# Patient Record
Sex: Female | Born: 1940 | Race: White | Hispanic: No | Smoking: Never smoker
Health system: Southern US, Community
[De-identification: ages and names within clinical notes are randomized; demographics above are authoritative.]

## PROBLEM LIST (undated history)

## (undated) DIAGNOSIS — T8859XA Other complications of anesthesia, initial encounter: Secondary | ICD-10-CM

## (undated) DIAGNOSIS — R112 Nausea with vomiting, unspecified: Secondary | ICD-10-CM

## (undated) DIAGNOSIS — Z9889 Other specified postprocedural states: Secondary | ICD-10-CM

## (undated) DIAGNOSIS — I10 Essential (primary) hypertension: Secondary | ICD-10-CM

## (undated) DIAGNOSIS — M1990 Unspecified osteoarthritis, unspecified site: Secondary | ICD-10-CM

## (undated) DIAGNOSIS — T4145XA Adverse effect of unspecified anesthetic, initial encounter: Secondary | ICD-10-CM

## (undated) HISTORY — PX: CHOLECYSTECTOMY: SHX55

## (undated) HISTORY — PX: ANKLE SURGERY: SHX546

## (undated) HISTORY — PX: ABDOMINAL HYSTERECTOMY: SHX81

---

## 1997-08-21 ENCOUNTER — Ambulatory Visit (HOSPITAL_COMMUNITY): Admission: RE | Admit: 1997-08-21 | Discharge: 1997-08-21 | Payer: Self-pay | Admitting: Family Medicine

## 2000-01-07 ENCOUNTER — Encounter: Payer: Self-pay | Admitting: Obstetrics & Gynecology

## 2000-01-07 ENCOUNTER — Ambulatory Visit (HOSPITAL_COMMUNITY): Admission: RE | Admit: 2000-01-07 | Discharge: 2000-01-07 | Payer: Self-pay | Admitting: Obstetrics & Gynecology

## 2000-12-16 ENCOUNTER — Encounter: Payer: Self-pay | Admitting: Surgery

## 2000-12-16 ENCOUNTER — Encounter (INDEPENDENT_AMBULATORY_CARE_PROVIDER_SITE_OTHER): Payer: Self-pay | Admitting: Specialist

## 2000-12-16 ENCOUNTER — Observation Stay (HOSPITAL_COMMUNITY): Admission: RE | Admit: 2000-12-16 | Discharge: 2000-12-17 | Payer: Self-pay | Admitting: Surgery

## 2001-01-06 ENCOUNTER — Encounter: Payer: Self-pay | Admitting: Family Medicine

## 2001-01-06 ENCOUNTER — Ambulatory Visit (HOSPITAL_COMMUNITY): Admission: RE | Admit: 2001-01-06 | Discharge: 2001-01-06 | Payer: Self-pay | Admitting: Family Medicine

## 2002-01-09 ENCOUNTER — Encounter: Payer: Self-pay | Admitting: Family Medicine

## 2002-01-09 ENCOUNTER — Ambulatory Visit (HOSPITAL_COMMUNITY): Admission: RE | Admit: 2002-01-09 | Discharge: 2002-01-09 | Payer: Self-pay | Admitting: Obstetrics & Gynecology

## 2003-01-11 ENCOUNTER — Ambulatory Visit (HOSPITAL_COMMUNITY): Admission: RE | Admit: 2003-01-11 | Discharge: 2003-01-11 | Payer: Self-pay | Admitting: Obstetrics & Gynecology

## 2004-01-22 ENCOUNTER — Ambulatory Visit (HOSPITAL_COMMUNITY): Admission: RE | Admit: 2004-01-22 | Discharge: 2004-01-22 | Payer: Self-pay | Admitting: Family Medicine

## 2005-03-03 ENCOUNTER — Ambulatory Visit (HOSPITAL_COMMUNITY): Admission: RE | Admit: 2005-03-03 | Discharge: 2005-03-03 | Payer: Self-pay | Admitting: Family Medicine

## 2006-03-31 ENCOUNTER — Ambulatory Visit (HOSPITAL_COMMUNITY): Admission: RE | Admit: 2006-03-31 | Discharge: 2006-03-31 | Payer: Self-pay | Admitting: Family Medicine

## 2007-04-05 ENCOUNTER — Ambulatory Visit (HOSPITAL_COMMUNITY): Admission: RE | Admit: 2007-04-05 | Discharge: 2007-04-05 | Payer: Self-pay | Admitting: Family Medicine

## 2010-05-30 NOTE — Op Note (Signed)
[place]  Patient:    Krystal Odom, Krystal Odom Visit Number: [id number] MRN: [id number]          Service Type: DSU Location: DAY Attending Physician:  Charlton Haws Dictated by:   Currie Paris, M.D. Proc. Date: 12/16/00 Admit Date:  12/16/2000   CC:         Teena Irani. Arlyce Dice, M.D.   Operative Report  VISIT NUMBER:  [id number]  CCS NUMBER:  [id number]  PREOPERATIVE DIAGNOSIS:  Chronic calculus cholecystitis.  POSTOPERATIVE DIAGNOSIS:  Chronic calculus cholecystitis.  OPERATION:  Laparoscopic cholecystectomy.  SURGEON:  Currie Paris, M.D.  ASSISTANT:  Sheppard Plumber. Earlene Plater, M.D.  ANESTHESIA:  General endotracheal.  CLINICAL HISTORY:  The patient is a 70 year old lady with fairly typical biliary symptoms and a large stone in her gallbladder, thought to be the cause of her symptoms.  DESCRIPTION OF PROCEDURE:  The patient was seen in the holding area and had no further questions.  She was taken to the operating room, and after satisfactory general endotracheal anesthesia had been obtained, the abdomen was prepped and draped.  I injected 0.25% plain Marcaine in each incision. The umbilical incision was made.  The fascia picked up with a Kocher and opened and under direct vision, the peritoneal cavity entered.  A pursestring was placed and Hasson cannula introduced and the abdomen insufflated to 15. No gross abnormalities were noted.  The patient was placed in reverse Trendelenburg and tilted to the left.  A 10 mm trocar was placed in the epigastric area, and two 5 mm laterally on the right side.  The gallbladder was retracted over the liver.  Some adhesions where the duodenum was stuck up on the lower half of the gallbladder were taken down either sharply or with cautery.  Once that was done, I was able to open the peritoneum on either side of the side of the cystic duct and into the triangle of Calot, and I was able to dissect out  what appeared to be the cystic duct.  Once that was identified, I carried the dissection further into the triangle of Calot to be sure there were no other ductal-type structures, and once I had that all clear, I could see the cystic artery, and could see the junction of the cystic duct with the gallbladder and the common duct.  Once the anatomy was clarified, I put three clips on the stay side of the cystic duct and one on the gallbladder side, and divided that duct.  A small branch was clipped with a single clip and then the artery clipped two times on the stay side, one on the go side, and divided.  The gallbladder was disconnected from the liver using the cautery, and starting from below and working to above.  Just prior to taking the last few attachments off, we irrigated and made sure everything was dry.  The gallbladder was disconnected and brought out the umbilical port.  I had to enlarge that a little bit because there was a single large stone in the gallbladder which would not come out the initial incision.  The umbilical port was occluded for a few moments while we reinsufflated, reirrigated, and made sure everything was dry, which it was.  The lateral ports were removed and there was no bleeding.  The umbilical port was closed with a previously placed pursestring.  The abdomen was deflated through the epigastric port.  The skin incision was closed with 4-0 subcuticular plus Steri-Strips.  The  patient tolerated the procedure well.  There were no operative complications.  All counts were correct. Dictated by:   Currie Paris, M.D. Attending Physician:  Charlton Haws DD:  12/16/00 TD:  12/16/00 Job: [id number]

## 2012-11-19 ENCOUNTER — Encounter (HOSPITAL_BASED_OUTPATIENT_CLINIC_OR_DEPARTMENT_OTHER): Payer: Self-pay | Admitting: Emergency Medicine

## 2012-11-19 ENCOUNTER — Emergency Department (HOSPITAL_BASED_OUTPATIENT_CLINIC_OR_DEPARTMENT_OTHER): Payer: Medicare Other

## 2012-11-19 ENCOUNTER — Emergency Department (HOSPITAL_BASED_OUTPATIENT_CLINIC_OR_DEPARTMENT_OTHER)
Admission: EM | Admit: 2012-11-19 | Discharge: 2012-11-19 | Disposition: A | Discharge status: Home / Self Care | Payer: Medicare Other | Attending: Emergency Medicine | Admitting: Emergency Medicine

## 2012-11-19 DIAGNOSIS — S52001A Unspecified fracture of upper end of right ulna, initial encounter for closed fracture: Secondary | ICD-10-CM

## 2012-11-19 DIAGNOSIS — W19XXXA Unspecified fall, initial encounter: Secondary | ICD-10-CM

## 2012-11-19 DIAGNOSIS — IMO0002 Reserved for concepts with insufficient information to code with codable children: Secondary | ICD-10-CM | POA: Insufficient documentation

## 2012-11-19 DIAGNOSIS — W010XXA Fall on same level from slipping, tripping and stumbling without subsequent striking against object, initial encounter: Secondary | ICD-10-CM | POA: Insufficient documentation

## 2012-11-19 DIAGNOSIS — Y9289 Other specified places as the place of occurrence of the external cause: Secondary | ICD-10-CM | POA: Insufficient documentation

## 2012-11-19 DIAGNOSIS — Z79899 Other long term (current) drug therapy: Secondary | ICD-10-CM | POA: Insufficient documentation

## 2012-11-19 DIAGNOSIS — I10 Essential (primary) hypertension: Secondary | ICD-10-CM | POA: Insufficient documentation

## 2012-11-19 DIAGNOSIS — Y9389 Activity, other specified: Secondary | ICD-10-CM | POA: Insufficient documentation

## 2012-11-19 DIAGNOSIS — S52123A Displaced fracture of head of unspecified radius, initial encounter for closed fracture: Secondary | ICD-10-CM | POA: Insufficient documentation

## 2012-11-19 DIAGNOSIS — S52009A Unspecified fracture of upper end of unspecified ulna, initial encounter for closed fracture: Secondary | ICD-10-CM | POA: Insufficient documentation

## 2012-11-19 HISTORY — DX: Essential (primary) hypertension: I10

## 2012-11-19 MED ORDER — HYDROCODONE-ACETAMINOPHEN 5-325 MG PO TABS: 2.0000 | ORAL_TABLET | ORAL | Status: DC | PRN

## 2012-11-19 MED ORDER — HYDROCODONE-ACETAMINOPHEN 5-325 MG PO TABS
2.0000 | ORAL_TABLET | Freq: Once | ORAL | Status: AC
Start: 2012-11-19 — End: 2012-11-19
  Administered 2012-11-19: 2 via ORAL
  Filled 2012-11-19: qty 2

## 2012-11-19 NOTE — ED Notes (Signed)
Patient transported to CT via stretcher per tech. 

## 2012-11-19 NOTE — ED Provider Notes (Signed)
Medical screening examination/treatment/procedure(s) were conducted as a shared visit with non-physician practitioner(s) and myself.  I personally evaluated the patient during the encounter. Patient is a 72 year old female who presents to the emergency department after a fall. She apparently tripped over a parking block in a parking lot and landed on her elbow. She also struck her head but did not lose consciousness. She is complaining of pain in the elbow and abrasions to the face.  On exam her vitals are stable the patient is afebrile. She is awake alert and appropriate. There are abrasions to the right for head above the right eyebrow, however no lacerations that require suturing. The neck is nontender without step-offs. The heart is regular rate and rhythm and lungs are clear. There is tenderness to palpation over the volar aspect of the right elbow. The distal pulses and motor are intact.  Workup reveals a negative head CT scan along with a fracture of the proximal ulna. This finding was discussed with Dr. Shelle Iron from orthopedics who felt as though this was more of a hand surgery issue. I then spoke with Dr. Izora Ribas who felt as though this could be handled by the patient's general orthopedist. The family has seen Dr. Shelle Iron in the past and they are extremely happy with the care he is provided them before and would like to keep him as the treating physician. They will call him on Monday to arrange followup. In the meantime they have been advised to elevate and ice the arm and we'll treat with pain medications.  EKG Interpretation   None        Geoffery Lyons, MD 11/19/12 2348

## 2012-11-19 NOTE — ED Notes (Signed)
I helped patient into a gown, then cleaned dried blood from patient's forehead and hands, took vital signs.

## 2012-11-19 NOTE — ED Provider Notes (Signed)
CSN: [id number]     Arrival date & time 11/19/12  1934 History   First MD Initiated Contact with Patient 11/19/12 1935     Chief Complaint  Patient presents with  . Fall   (Consider location/radiation/quality/duration/timing/severity/associated sxs/prior Treatment) HPI Comments: Patient is a 72 year old female who presents to the ED after a mechanical fall that occurred prior to arrival. Patient reports tripping in a parking lot and landing on her right forearm and face. Patient reports sudden onset of throbbing and severe pain to her right forearm with radiation down her right arm. Movement makes the pain worse. No alleviating factors. Patient denies LOC but does report a right side facial abrasion. Patient denies any other injury.    Past Medical History  Diagnosis Date  . Hypertension    Past Surgical History  Procedure Laterality Date  . Cervical spine surgery    . Cholecystectomy    . Abdominal hysterectomy    . Ankle surgery     No family history on file. History  Substance Use Topics  . Smoking status: Never Smoker   . Smokeless tobacco: Not on file  . Alcohol Use: No   OB History   Grav Para Term Preterm Abortions TAB SAB Ect Mult Living                 Review of Systems  Musculoskeletal: Positive for arthralgias, joint swelling and myalgias.  All other systems reviewed and are negative.    Allergies  Nsaids  Home Medications   Current Outpatient Rx  Name  Route  Sig  Dispense  Refill  . amLODipine (NORVASC) 10 MG tablet   Oral   Take 10 mg by mouth daily.         . hydrochlorothiazide (MICROZIDE) 12.5 MG capsule   Oral   Take 12.5 mg by mouth daily.          BP 147/58  Pulse 76  Temp(Src) 97.5 F (36.4 C) (Oral)  Resp 20  SpO2 96% Physical Exam  Nursing note and vitals reviewed. Constitutional: She is oriented to person, place, and time. She appears well-developed and well-nourished. No distress.  HENT:  Head: Normocephalic and  atraumatic.  Eyes: Conjunctivae and EOM are normal.  Neck: Normal range of motion.  Cardiovascular: Normal rate, regular rhythm and intact distal pulses.  Exam reveals no gallop and no friction rub.   No murmur heard. Sufficient capillary refill of right hand.   Pulmonary/Chest: Effort normal and breath sounds normal. She has no wheezes. She has no rales. She exhibits no tenderness.  Musculoskeletal:  Right proximal forearm tenderness to palpation with associated edema. Right elbow and wrist ROM limited due to pain.   Neurological: She is alert and oriented to person, place, and time. Coordination normal.  Speech is goal-oriented. Moves limbs without ataxia.   Skin: Skin is warm and dry.  Psychiatric: She has a normal mood and affect. Her behavior is normal.    ED Course  Procedures (including critical care time)   SPLINT APPLICATION Date/Time: 05/20/2012 3:38 PM Authorized by: Emilia Beck Consent: Verbal consent obtained. Risks and benefits: risks, benefits and alternatives were discussed Consent given by: patient Splint applied by: orthopedic technician Location details: right forearm Splint type: long arm posterior Supplies used: orthoglass Post-procedure: The splinted body part was neurovascularly unchanged following the procedure. Patient tolerance: Patient tolerated the procedure well with no immediate complications.   Labs Review Labs Reviewed - No data to display Imaging Review  Dg Elbow Complete Right  11/19/2012   CLINICAL DATA:  Fall  EXAM: RIGHT ELBOW - COMPLETE 3+ VIEW  COMPARISON:  None.  FINDINGS: There is an acute, comminuted fracture deformity involving the proximal ulna. There is volar angulation of the distal fracture fragments. Fracture of the radial head is also identified with mild distal displacement of the distal fracture fragment.  IMPRESSION: 1. Comminuted fracture of the proximal ulna with volar angulation of the distal fracture fragments.  2. Radial  head fracture.   Electronically Signed   By: Signa Kell M.D.   On: 11/19/2012 20:17   Dg Forearm Right  11/19/2012   CLINICAL DATA:  Fall. Injury  EXAM: RIGHT FOREARM - 2 VIEW  COMPARISON:  None  FINDINGS: There is an acute, comminuted fracture deformity involving the proximal ulna. There is volar angulation of the distal fracture fragments. No dislocations identified.  IMPRESSION: 1. Acute comminuted fracture involves the proximal ulna.   Electronically Signed   By: Signa Kell M.D.   On: 11/19/2012 20:15    EKG Interpretation   None       MDM   1. Fall, initial encounter   2. Fracture, ulna, proximal, right, closed, initial encounter     9:14 PM Xray of right forearm shows comminuted proximal ulna fracture. No neurovascular compromise. Patient will have a posterior long arm splint and recommended follow up with Dr. Izora Ribas. Patient given Vicodin for pain.   9:57 PM CT head unremarkable. Splint intact. Patient will have recommended follow up with Dr. Izora Ribas.   Emilia Beck, PA-C 11/19/12 2201

## 2012-11-19 NOTE — ED Notes (Signed)
Pt had a fall onto right forearm with deformity.  Last po intake was approx 30 min pta.  Hit head no loc.

## 2012-11-22 ENCOUNTER — Encounter (HOSPITAL_COMMUNITY): Payer: Self-pay | Admitting: *Deleted

## 2012-11-22 ENCOUNTER — Encounter (HOSPITAL_COMMUNITY): Payer: Self-pay | Admitting: Pharmacy Technician

## 2012-11-22 MED ORDER — CEFAZOLIN SODIUM-DEXTROSE 2-3 GM-% IV SOLR
2.0000 g | INTRAVENOUS | Status: AC
  Administered 2012-11-23: 2 g via INTRAVENOUS
  Filled 2012-11-22: qty 50

## 2012-11-22 NOTE — Progress Notes (Signed)
Patient's daughter, Judeth Cornfield voiced concern about patient being NPO after midnight, "I was told that surgery will probably be around 1830."  I spoke with Dr Melvyn Novas to see if patient could have a later time to be NPO and he said "No, I hope to have her in surgery earlier."  I informed Judeth Cornfield that NPO remains after midnight and hopefully surgery will be prior to 1830.

## 2012-11-23 ENCOUNTER — Encounter (HOSPITAL_COMMUNITY)
Admission: RE | Disposition: A | Discharge status: Home / Self Care | Payer: Self-pay | Source: Ambulatory Visit | Attending: Orthopedic Surgery

## 2012-11-23 ENCOUNTER — Encounter (HOSPITAL_COMMUNITY): Payer: Self-pay | Admitting: *Deleted

## 2012-11-23 ENCOUNTER — Observation Stay (HOSPITAL_COMMUNITY)
Admission: RE | Admit: 2012-11-23 | Discharge: 2012-11-24 | Disposition: A | Discharge status: Home / Self Care | Payer: Medicare Other | Source: Ambulatory Visit | Attending: Orthopedic Surgery | Admitting: Orthopedic Surgery

## 2012-11-23 ENCOUNTER — Ambulatory Visit (HOSPITAL_COMMUNITY): Payer: Medicare Other | Admitting: Anesthesiology

## 2012-11-23 ENCOUNTER — Ambulatory Visit (HOSPITAL_COMMUNITY): Payer: Medicare Other

## 2012-11-23 ENCOUNTER — Encounter (HOSPITAL_COMMUNITY): Payer: Medicare Other | Admitting: Anesthesiology

## 2012-11-23 DIAGNOSIS — I10 Essential (primary) hypertension: Secondary | ICD-10-CM | POA: Insufficient documentation

## 2012-11-23 DIAGNOSIS — S52279A Monteggia's fracture of unspecified ulna, initial encounter for closed fracture: Principal | ICD-10-CM | POA: Insufficient documentation

## 2012-11-23 DIAGNOSIS — S52599A Other fractures of lower end of unspecified radius, initial encounter for closed fracture: Secondary | ICD-10-CM | POA: Insufficient documentation

## 2012-11-23 DIAGNOSIS — S53449A Ulnar collateral ligament sprain of unspecified elbow, initial encounter: Secondary | ICD-10-CM | POA: Insufficient documentation

## 2012-11-23 DIAGNOSIS — W19XXXA Unspecified fall, initial encounter: Secondary | ICD-10-CM | POA: Insufficient documentation

## 2012-11-23 HISTORY — DX: Unspecified osteoarthritis, unspecified site: M19.90

## 2012-11-23 HISTORY — DX: Nausea with vomiting, unspecified: R11.2

## 2012-11-23 HISTORY — PX: ORIF ELBOW FRACTURE: SHX5031

## 2012-11-23 HISTORY — PX: TOTAL ELBOW ARTHROPLASTY: SHX812

## 2012-11-23 HISTORY — DX: Other complications of anesthesia, initial encounter: T88.59XA

## 2012-11-23 HISTORY — DX: Other specified postprocedural states: Z98.890

## 2012-11-23 HISTORY — DX: Adverse effect of unspecified anesthetic, initial encounter: T41.45XA

## 2012-11-23 LAB — BASIC METABOLIC PANEL
BUN: 13 mg/dL (ref 6–23)
CO2: 24 mEq/L (ref 19–32)
Calcium: 9.1 mg/dL (ref 8.4–10.5)
Chloride: 99 mEq/L (ref 96–112)
GFR calc Af Amer: >90 mL/min (ref >90)
GFR calc non Af Amer: 88 mL/min — ABNORMAL LOW (ref >90)
Glucose, Bld: 117 mg/dL — ABNORMAL HIGH (ref 70–99)
Potassium: 5.1 mEq/L (ref 3.5–5.1)
Sodium: 136 mEq/L (ref 135–145)

## 2012-11-23 LAB — CBC
HCT: 42.4 % (ref 36.0–46.0)
Hemoglobin: 15.3 g/dL — ABNORMAL HIGH (ref 12.0–15.0)
MCH: 33.1 pg (ref 26.0–34.0)
MCHC: 36.1 g/dL — ABNORMAL HIGH (ref 30.0–36.0)
Platelets: 260 10*3/uL (ref 150–400)
RBC: 4.62 MIL/uL (ref 3.87–5.11)
WBC: 11.4 10*3/uL — ABNORMAL HIGH (ref 4.0–10.5)

## 2012-11-23 SURGERY — OPEN REDUCTION INTERNAL FIXATION (ORIF) ELBOW/OLECRANON FRACTURE
Anesthesia: General | Site: Elbow | Laterality: Right

## 2012-11-23 MED ORDER — DIPHENHYDRAMINE HCL 25 MG PO CAPS: 25.0000 mg | ORAL_CAPSULE | Freq: Four times a day (QID) | ORAL | Status: DC | PRN

## 2012-11-23 MED ORDER — FENTANYL CITRATE 0.05 MG/ML IJ SOLN
INTRAMUSCULAR | Status: AC
  Administered 2012-11-23: 50 ug
  Filled 2012-11-23: qty 2

## 2012-11-23 MED ORDER — BUPIVACAINE HCL (PF) 0.25 % IJ SOLN
INTRAMUSCULAR | Status: DC | PRN
  Administered 2012-11-23: 30 mL

## 2012-11-23 MED ORDER — MIDAZOLAM HCL 2 MG/2ML IJ SOLN
INTRAMUSCULAR | Status: AC
  Administered 2012-11-23: 2 mg
  Filled 2012-11-23: qty 2

## 2012-11-23 MED ORDER — ONDANSETRON HCL 4 MG/2ML IJ SOLN
INTRAMUSCULAR | Status: DC | PRN
  Administered 2012-11-23: 4 mg via INTRAVENOUS

## 2012-11-23 MED ORDER — VITAMIN C 500 MG PO TABS: 500.0000 mg | ORAL_TABLET | Freq: Two times a day (BID) | ORAL | Status: AC

## 2012-11-23 MED ORDER — WHITE PETROLATUM GEL
Status: AC
  Filled 2012-11-23: qty 5

## 2012-11-23 MED ORDER — CHLORHEXIDINE GLUCONATE 4 % EX LIQD: 60.0000 mL | Freq: Once | CUTANEOUS | Status: DC

## 2012-11-23 MED ORDER — ONDANSETRON HCL 4 MG PO TABS: 4.0000 mg | ORAL_TABLET | Freq: Four times a day (QID) | ORAL | Status: DC | PRN

## 2012-11-23 MED ORDER — LIDOCAINE HCL (CARDIAC) 20 MG/ML IV SOLN
INTRAVENOUS | Status: DC | PRN
  Administered 2012-11-23: 60 mg via INTRAVENOUS

## 2012-11-23 MED ORDER — METHOCARBAMOL 100 MG/ML IJ SOLN
500.0000 mg | Freq: Four times a day (QID) | INTRAMUSCULAR | Status: DC | PRN
  Filled 2012-11-23: qty 5

## 2012-11-23 MED ORDER — ZOLPIDEM TARTRATE 5 MG PO TABS: 5.0000 mg | ORAL_TABLET | Freq: Every evening | ORAL | Status: DC | PRN

## 2012-11-23 MED ORDER — KCL IN DEXTROSE-NACL 20-5-0.45 MEQ/L-%-% IV SOLN
INTRAVENOUS | Status: DC
  Administered 2012-11-23: 23:00:00 via INTRAVENOUS
  Filled 2012-11-23 (×3): qty 1000

## 2012-11-23 MED ORDER — FENTANYL CITRATE 0.05 MG/ML IJ SOLN
INTRAMUSCULAR | Status: DC | PRN
  Administered 2012-11-23: 75 ug via INTRAVENOUS

## 2012-11-23 MED ORDER — ROCURONIUM BROMIDE 100 MG/10ML IV SOLN
INTRAVENOUS | Status: DC | PRN
  Administered 2012-11-23: 20 mg via INTRAVENOUS

## 2012-11-23 MED ORDER — HYDROMORPHONE HCL PF 1 MG/ML IJ SOLN: 0.2500 mg | INTRAMUSCULAR | Status: DC | PRN

## 2012-11-23 MED ORDER — ONDANSETRON HCL 4 MG/2ML IJ SOLN: 4.0000 mg | Freq: Once | INTRAMUSCULAR | Status: DC | PRN

## 2012-11-23 MED ORDER — LACTATED RINGERS IV SOLN
INTRAVENOUS | Status: DC | PRN
  Administered 2012-11-23 (×2): via INTRAVENOUS

## 2012-11-23 MED ORDER — ALBUTEROL SULFATE HFA 108 (90 BASE) MCG/ACT IN AERS
INHALATION_SPRAY | RESPIRATORY_TRACT | Status: DC | PRN
  Administered 2012-11-23 (×3): 2 via RESPIRATORY_TRACT

## 2012-11-23 MED ORDER — OXYCODONE HCL 5 MG PO TABS: 5.0000 mg | ORAL_TABLET | ORAL | Status: DC | PRN

## 2012-11-23 MED ORDER — DOCUSATE SODIUM 100 MG PO CAPS: 100.0000 mg | ORAL_CAPSULE | Freq: Two times a day (BID) | ORAL | Status: AC

## 2012-11-23 MED ORDER — VITAMIN C 500 MG PO TABS
1000.0000 mg | ORAL_TABLET | Freq: Every day | ORAL | Status: DC
  Administered 2012-11-24: 1000 mg via ORAL
  Filled 2012-11-23: qty 2

## 2012-11-23 MED ORDER — HYDROMORPHONE HCL PF 1 MG/ML IJ SOLN: 0.5000 mg | INTRAMUSCULAR | Status: DC | PRN

## 2012-11-23 MED ORDER — PROMETHAZINE HCL 25 MG RE SUPP: 12.5000 mg | Freq: Four times a day (QID) | RECTAL | Status: DC | PRN

## 2012-11-23 MED ORDER — DOCUSATE SODIUM 100 MG PO CAPS
100.0000 mg | ORAL_CAPSULE | Freq: Two times a day (BID) | ORAL | Status: DC
  Administered 2012-11-23 – 2012-11-24 (×2): 100 mg via ORAL
  Filled 2012-11-23 (×2): qty 1

## 2012-11-23 MED ORDER — 0.9 % SODIUM CHLORIDE (POUR BTL) OPTIME
TOPICAL | Status: DC | PRN
  Administered 2012-11-23: 1000 mL

## 2012-11-23 MED ORDER — ALBUMIN HUMAN 5 % IV SOLN
INTRAVENOUS | Status: DC | PRN
  Administered 2012-11-23: 17:00:00 via INTRAVENOUS

## 2012-11-23 MED ORDER — PHENYLEPHRINE HCL 10 MG/ML IJ SOLN
INTRAMUSCULAR | Status: DC | PRN
  Administered 2012-11-23: 120 ug via INTRAVENOUS
  Administered 2012-11-23 (×2): 80 ug via INTRAVENOUS
  Administered 2012-11-23: 120 ug via INTRAVENOUS

## 2012-11-23 MED ORDER — HYDROCHLOROTHIAZIDE 12.5 MG PO CAPS
12.5000 mg | ORAL_CAPSULE | Freq: Every day | ORAL | Status: DC
  Administered 2012-11-24: 12.5 mg via ORAL
  Filled 2012-11-23 (×2): qty 1

## 2012-11-23 MED ORDER — LACTATED RINGERS IV SOLN
INTRAVENOUS | Status: DC
  Administered 2012-11-23: 15:00:00 via INTRAVENOUS

## 2012-11-23 MED ORDER — NEOSTIGMINE METHYLSULFATE 1 MG/ML IJ SOLN
INTRAMUSCULAR | Status: DC | PRN
  Administered 2012-11-23: 3 mg via INTRAVENOUS

## 2012-11-23 MED ORDER — AMLODIPINE BESYLATE 10 MG PO TABS
10.0000 mg | ORAL_TABLET | Freq: Every day | ORAL | Status: DC
  Administered 2012-11-24: 10 mg via ORAL
  Filled 2012-11-23 (×2): qty 1

## 2012-11-23 MED ORDER — ARTIFICIAL TEARS OP OINT
TOPICAL_OINTMENT | OPHTHALMIC | Status: DC | PRN
  Administered 2012-11-23: 1 via OPHTHALMIC

## 2012-11-23 MED ORDER — EPHEDRINE SULFATE 50 MG/ML IJ SOLN
INTRAMUSCULAR | Status: DC | PRN
  Administered 2012-11-23 (×2): 10 mg via INTRAVENOUS

## 2012-11-23 MED ORDER — VANCOMYCIN HCL IN DEXTROSE 1-5 GM/200ML-% IV SOLN
1000.0000 mg | Freq: Once | INTRAVENOUS | Status: AC
  Administered 2012-11-24: 1000 mg via INTRAVENOUS
  Filled 2012-11-23: qty 200

## 2012-11-23 MED ORDER — ONDANSETRON HCL 4 MG/2ML IJ SOLN: 4.0000 mg | Freq: Four times a day (QID) | INTRAMUSCULAR | Status: DC | PRN

## 2012-11-23 MED ORDER — ADULT MULTIVITAMIN W/MINERALS CH
1.0000 | ORAL_TABLET | Freq: Every day | ORAL | Status: DC
  Filled 2012-11-23: qty 1

## 2012-11-23 MED ORDER — OXYCODONE-ACETAMINOPHEN 10-325 MG PO TABS: 1.0000 | ORAL_TABLET | ORAL | Status: AC | PRN

## 2012-11-23 MED ORDER — PHENYLEPHRINE HCL 10 MG/ML IJ SOLN
10.0000 mg | INTRAVENOUS | Status: DC | PRN
  Administered 2012-11-23: 10 ug/min via INTRAVENOUS

## 2012-11-23 MED ORDER — PROPOFOL 10 MG/ML IV BOLUS
INTRAVENOUS | Status: DC | PRN
  Administered 2012-11-23: 200 mg via INTRAVENOUS

## 2012-11-23 MED ORDER — METHOCARBAMOL 500 MG PO TABS: 500.0000 mg | ORAL_TABLET | Freq: Four times a day (QID) | ORAL | Status: DC | PRN

## 2012-11-23 MED ORDER — HYDROCODONE-ACETAMINOPHEN 5-325 MG PO TABS
1.0000 | ORAL_TABLET | ORAL | Status: DC | PRN
  Administered 2012-11-24 (×2): 2 via ORAL
  Filled 2012-11-23 (×2): qty 2

## 2012-11-23 MED ORDER — GLYCOPYRROLATE 0.2 MG/ML IJ SOLN
INTRAMUSCULAR | Status: DC | PRN
  Administered 2012-11-23: .6 mg via INTRAVENOUS

## 2012-11-23 SURGICAL SUPPLY — 104 items
ANCHOR SUT 1.45 SZ 1 SHORT (Anchor) ×2 IMPLANT
BANDAGE ELASTIC 3 VELCRO ST LF (GAUZE/BANDAGES/DRESSINGS) IMPLANT
BANDAGE ELASTIC 4 VELCRO ST LF (GAUZE/BANDAGES/DRESSINGS) ×2 IMPLANT
BANDAGE GAUZE ELAST BULKY 4 IN (GAUZE/BANDAGES/DRESSINGS) IMPLANT
BIT DRILL 2.5X2.75 QC CALB (BIT) ×2 IMPLANT
BIT DRILL CALIBRATED 2.7 (BIT) ×2 IMPLANT
BIT DRILL CANN 1.6 BIODRIVE (BIT) ×2 IMPLANT
BLADE AVERAGE 25X9 (BLADE) IMPLANT
BLADE OSCILLATING /SAGITTAL (BLADE) IMPLANT
BLADE SURG 10 STRL SS (BLADE) IMPLANT
BLADE SURG 15 STRL LF DISP TIS (BLADE) IMPLANT
BLADE SURG 15 STRL SS (BLADE)
BNDG COHESIVE 4X5 TAN STRL (GAUZE/BANDAGES/DRESSINGS) IMPLANT
BNDG ESMARK 4X9 LF (GAUZE/BANDAGES/DRESSINGS) ×2 IMPLANT
BOWL SMART MIX CTS (DISPOSABLE) IMPLANT
CANISTER SUCTION WELLS/JOHNSON (MISCELLANEOUS) ×2 IMPLANT
CLOTH BEACON ORANGE TIMEOUT ST (SAFETY) IMPLANT
CORDS BIPOLAR (ELECTRODE) ×2 IMPLANT
COVER MAYO STAND STRL (DRAPES) IMPLANT
COVER SURGICAL LIGHT HANDLE (MISCELLANEOUS) ×2 IMPLANT
CUFF TOURNIQUET SINGLE 18IN (TOURNIQUET CUFF) ×2 IMPLANT
CUFF TOURNIQUET SINGLE 24IN (TOURNIQUET CUFF) IMPLANT
DRAPE INCISE IOBAN 66X45 STRL (DRAPES) ×2 IMPLANT
DRAPE OEC MINIVIEW 54X84 (DRAPES) ×2 IMPLANT
DRAPE ORTHO SPLIT 77X108 STRL (DRAPES) ×2
DRAPE SURG 17X11 SM STRL (DRAPES) ×2 IMPLANT
DRAPE SURG ORHT 6 SPLT 77X108 (DRAPES) ×2 IMPLANT
DRAPE U-SHAPE 47X51 STRL (DRAPES) ×2 IMPLANT
DRSG ADAPTIC 3X8 NADH LF (GAUZE/BANDAGES/DRESSINGS) IMPLANT
ELECT REM PT RETURN 9FT ADLT (ELECTROSURGICAL) ×2
ELECTRODE REM PT RTRN 9FT ADLT (ELECTROSURGICAL) ×1 IMPLANT
EVACUATOR 1/8 PVC DRAIN (DRAIN) IMPLANT
GAUZE XEROFORM 5X9 LF (GAUZE/BANDAGES/DRESSINGS) ×2 IMPLANT
GLOVE BIO SURGEON STRL SZ7 (GLOVE) ×2 IMPLANT
GLOVE BIOGEL PI IND STRL 6.5 (GLOVE) ×1 IMPLANT
GLOVE BIOGEL PI IND STRL 8 (GLOVE) ×1 IMPLANT
GLOVE BIOGEL PI IND STRL 8.5 (GLOVE) ×1 IMPLANT
GLOVE BIOGEL PI INDICATOR 6.5 (GLOVE) ×1
GLOVE BIOGEL PI INDICATOR 8 (GLOVE) ×1
GLOVE BIOGEL PI INDICATOR 8.5 (GLOVE) ×1
GLOVE SURG ORTHO 8.0 STRL STRW (GLOVE) ×2 IMPLANT
GLOVE SURG SS PI 6.5 STRL IVOR (GLOVE) ×2 IMPLANT
GOWN PREVENTION PLUS XLARGE (GOWN DISPOSABLE) ×4 IMPLANT
GOWN STRL NON-REIN LRG LVL3 (GOWN DISPOSABLE) ×2 IMPLANT
HANDPIECE INTERPULSE COAX TIP (DISPOSABLE)
K-WIRE FIXATION 2.0X6 (WIRE) ×4
KIT BASIN OR (CUSTOM PROCEDURE TRAY) ×2 IMPLANT
KIT ROOM TURNOVER OR (KITS) ×2 IMPLANT
KWIRE FIXATION 2.0X6 (WIRE) ×2 IMPLANT
LOOP VESSEL MAXI BLUE (MISCELLANEOUS) ×2 IMPLANT
MANIFOLD NEPTUNE II (INSTRUMENTS) IMPLANT
NEEDLE HYPO 25GX1X1/2 BEV (NEEDLE) ×2 IMPLANT
NOZZLE PRISM 8.5MM (MISCELLANEOUS) IMPLANT
NS IRRIG 1000ML POUR BTL (IV SOLUTION) ×2 IMPLANT
PACK ORTHO EXTREMITY (CUSTOM PROCEDURE TRAY) ×2 IMPLANT
PAD ARMBOARD 7.5X6 YLW CONV (MISCELLANEOUS) ×4 IMPLANT
PAD CAST 4YDX4 CTTN HI CHSV (CAST SUPPLIES) IMPLANT
PADDING CAST ABS 4INX4YD NS (CAST SUPPLIES) ×1
PADDING CAST ABS COTTON 4X4 ST (CAST SUPPLIES) ×1 IMPLANT
PADDING CAST COTTON 4X4 STRL (CAST SUPPLIES)
PASSER SUT SWANSON 36MM LOOP (INSTRUMENTS) IMPLANT
PENCIL BUTTON HOLSTER BLD 10FT (ELECTRODE) ×2 IMPLANT
PLATE OLECRANON LRG (Plate) ×2 IMPLANT
SCREW BIODRIVE MICRO 2.3X16 (Screw) ×2 IMPLANT
SCREW CORT 3.5X40 (Screw) ×2 IMPLANT
SCREW LOCK CORT STAR 3.5X10 (Screw) ×2 IMPLANT
SCREW LOCK CORT STAR 3.5X12 (Screw) ×2 IMPLANT
SCREW LOCK CORT STAR 3.5X16 (Screw) ×4 IMPLANT
SCREW LOCK CORT STAR 3.5X18 (Screw) ×2 IMPLANT
SCREW NON LOCKING LP 3.5 16MM (Screw) ×2 IMPLANT
SET HNDPC FAN SPRY TIP SCT (DISPOSABLE) IMPLANT
SLING ARM IMMOBILIZER LRG (SOFTGOODS) ×2 IMPLANT
SOAP 2 % CHG 4 OZ (WOUND CARE) ×2 IMPLANT
SPLINT FIBERGLASS 4X30 (CAST SUPPLIES) ×4 IMPLANT
SPONGE GAUZE 4X4 12PLY (GAUZE/BANDAGES/DRESSINGS) ×2 IMPLANT
SPONGE LAP 18X18 X RAY DECT (DISPOSABLE) ×2 IMPLANT
SPONGE LAP 4X18 X RAY DECT (DISPOSABLE) ×2 IMPLANT
STAPLER VISISTAT 35W (STAPLE) ×2 IMPLANT
SUCTION FRAZIER TIP 10 FR DISP (SUCTIONS) ×2 IMPLANT
SUT ETHIBOND 5 LR DA (SUTURE) IMPLANT
SUT ETHIBOND NAB CT1 #1 30IN (SUTURE) IMPLANT
SUT ETHILON 4 0 FS 1 (SUTURE) IMPLANT
SUT FIBERWIRE #2 38 REV NDL BL (SUTURE)
SUT MERSILENE 4 0 P 3 (SUTURE) IMPLANT
SUT PROLENE 4 0 PS 2 18 (SUTURE) IMPLANT
SUT VIC AB 0 CT1 27 (SUTURE) ×3
SUT VIC AB 0 CT1 27XBRD ANBCTR (SUTURE) ×3 IMPLANT
SUT VIC AB 2-0 CT1 27 (SUTURE) ×1
SUT VIC AB 2-0 CT1 TAPERPNT 27 (SUTURE) ×1 IMPLANT
SUT VIC AB 3-0 PS1 18 (SUTURE)
SUT VIC AB 3-0 PS1 18XBRD (SUTURE) IMPLANT
SUT VICRYL 0 CT 1 36IN (SUTURE) IMPLANT
SUT VICRYL 4-0 PS2 18IN ABS (SUTURE) ×2 IMPLANT
SUTURE FIBERWR#2 38 REV NDL BL (SUTURE) IMPLANT
SYR 3ML LL SCALE MARK (SYRINGE) IMPLANT
SYR CONTROL 10ML LL (SYRINGE) ×2 IMPLANT
SYR TOOMEY 50ML (SYRINGE) IMPLANT
TOWEL OR 17X24 6PK STRL BLUE (TOWEL DISPOSABLE) ×2 IMPLANT
TOWEL OR 17X26 10 PK STRL BLUE (TOWEL DISPOSABLE) ×2 IMPLANT
TRAY FOLEY CATH 16FRSI W/METER (SET/KITS/TRAYS/PACK) IMPLANT
TUBE CONNECTING 12X1/4 (SUCTIONS) ×2 IMPLANT
UNDERPAD 30X30 INCONTINENT (UNDERPADS AND DIAPERS) IMPLANT
WASHER 3.5MM (Orthopedic Implant) ×2 IMPLANT
WATER STERILE IRR 1000ML POUR (IV SOLUTION) IMPLANT

## 2012-11-23 NOTE — Anesthesia Preprocedure Evaluation (Signed)
Anesthesia Evaluation  Patient identified by MRN, date of birth, ID band Patient awake    Reviewed: Allergy & Precautions, H&P , NPO status , Patient's Chart, lab work & pertinent test results, reviewed documented beta blocker date and time   History of Anesthesia Complications (+) PONV and history of anesthetic complications  Airway Mallampati: II TM Distance: >3 FB Neck ROM: full    Dental   Pulmonary neg pulmonary ROS,  breath sounds clear to auscultation        Cardiovascular hypertension, Pt. on medications Rhythm:regular     Neuro/Psych negative neurological ROS  negative psych ROS   GI/Hepatic negative GI ROS, Neg liver ROS,   Endo/Other  negative endocrine ROS  Renal/GU negative Renal ROS  negative genitourinary   Musculoskeletal   Abdominal   Peds  Hematology negative hematology ROS (+)   Anesthesia Other Findings See surgeon's H&P   Reproductive/Obstetrics negative OB ROS                           Anesthesia Physical Anesthesia Plan  ASA: II  Anesthesia Plan: General   Post-op Pain Management:    Induction: Intravenous  Airway Management Planned: Oral ETT  Additional Equipment:   Intra-op Plan:   Post-operative Plan: Extubation in OR  Informed Consent: I have reviewed the patients History and Physical, chart, labs and discussed the procedure including the risks, benefits and alternatives for the proposed anesthesia with the patient or authorized representative who has indicated his/her understanding and acceptance.   Dental Advisory Given  Plan Discussed with: CRNA and Surgeon  Anesthesia Plan Comments:         Anesthesia Quick Evaluation

## 2012-11-23 NOTE — Anesthesia Procedure Notes (Addendum)
Anesthesia Regional Block:  Supraclavicular block  Pre-Anesthetic Checklist: ,, timeout performed, Correct Patient, Correct Site, Correct Laterality, Correct Procedure, Correct Position, site marked, Risks and benefits discussed,  Surgical consent,  Pre-op evaluation,  At surgeon's request and post-op pain management  Laterality: Right  Prep: chloraprep       Needles:   Needle Type: Other     Needle Length: 9cm  Needle Gauge: 21 and 21 G    Additional Needles:  Procedures: ultrasound guided (picture in chart) Supraclavicular block Narrative:  Start time: 11/23/2012 3:16 PM End time: 11/23/2012 3:23 PM Injection made incrementally with aspirations every 5 mL.  Performed by: Personally  Anesthesiologist: C Frederick  Additional Notes: Ultrasound guidance used to: id relevant anatomy, confirm needle position, local anesthetic spread, avoidance of vascular puncture. Picture saved. No complications. Block performed personally by Janetta Hora. Gelene Mink, MD    Supraclavicular block Procedure Name: Intubation Date/Time: 11/23/2012 5:02 PM Performed by: Angelica Pou Pre-anesthesia Checklist: Patient identified, Timeout performed, Emergency Drugs available, Suction available and Patient being monitored Patient Re-evaluated:Patient Re-evaluated prior to inductionOxygen Delivery Method: Circle system utilized Preoxygenation: Pre-oxygenation with 100% oxygen Intubation Type: IV induction Ventilation: Two handed mask ventilation required and Oral airway inserted - appropriate to patient size Laryngoscope Size: Mac and 3 Grade View: Grade III Tube type: Oral Tube size: 7.5 mm Number of attempts: 1 Airway Equipment and Method: Bougie stylet and Oral airway Placement Confirmation: ETT inserted through vocal cords under direct vision,  breath sounds checked- equal and bilateral and positive ETCO2 Secured at: 23 cm Tube secured with: Tape Dental Injury: Teeth and Oropharynx  as per pre-operative assessment  Comments: Smooth IV induction by Dr Katrinka Blazing; easy ATOI using bougie stylet by Pasty Arch CRNA.

## 2012-11-23 NOTE — Anesthesia Postprocedure Evaluation (Signed)
  Anesthesia Post-op Note  Patient: Krystal Odom  Procedure(s) Performed: Procedure(s): RIGHT ELBOW OPEN REDUCTION INTERNAL FIXATION (ORIF)  (Right) AND POSSIBLE RADIAL HEAD REPLACEMENT  (Right)  Patient Location: PACU  Anesthesia Type:General  Level of Consciousness: awake  Airway and Oxygen Therapy: Patient Spontanous Breathing and Patient connected to nasal cannula oxygen  Post-op Pain: mild  Post-op Assessment: Post-op Vital signs reviewed  Post-op Vital Signs: Reviewed  Complications: No apparent anesthesia complications

## 2012-11-23 NOTE — Transfer of Care (Signed)
Immediate Anesthesia Transfer of Care Note  Patient: Krystal Odom  Procedure(s) Performed: Procedure(s): RIGHT ELBOW OPEN REDUCTION INTERNAL FIXATION (ORIF)  (Right) AND POSSIBLE RADIAL HEAD REPLACEMENT  (Right)  Patient Location: PACU  Anesthesia Type:General  Level of Consciousness: awake, alert , oriented and patient cooperative  Airway & Oxygen Therapy: Patient Spontanous Breathing and Patient connected to nasal cannula oxygen  Post-op Assessment: Report given to PACU RN, Post -op Vital signs reviewed and stable and Patient moving all extremities  Post vital signs: Reviewed and stable, strong pulmonary toilet with + blood tinged sputum. Dr Ivin Booty aware.  Complications: No apparent anesthesia complications

## 2012-11-23 NOTE — Brief Op Note (Signed)
11/23/2012  4:41 PM  PATIENT:  Krystal Odom  72 y.o. female  PRE-OPERATIVE DIAGNOSIS:  RIGHT ELBOW FRACTURE/DISLOCATION  POST-OPERATIVE DIAGNOSIS: SAME  PROCEDURE:  Procedure(s): RIGHT ELBOW OPEN REDUCTION INTERNAL FIXATION (ORIF)  (Right) AND POSSIBLE RADIAL HEAD REPLACEMENT  (Right)  SURGEON:  Surgeon(s) and Role:    * Sharma Covert, MD - Primary  PHYSICIAN ASSISTANT: NONE  ASSISTANTS: none   ANESTHESIA:   general  EBL:   MINIMAL  BLOOD ADMINISTERED:none  DRAINS: none   LOCAL MEDICATIONS USED:  None  SPECIMEN:  No Specimen  DISPOSITION OF SPECIMEN:  N/A  COUNTS:  YES  TOURNIQUET: less than 120 min   DICTATION: .841324  PLAN OF CARE: Admit for overnight observation  PATIENT DISPOSITION:  PACU - hemodynamically stable.   Delay start of Pharmacological VTE agent (>24hrs) due to surgical blood loss or risk of bleeding: not applicable

## 2012-11-23 NOTE — H&P (Signed)
Krystal Odom is an 72 y.o. female.   Chief Complaint: Right arm pain HPI: Pt with fall this past weekend Sustained fracture dislocation to right elbow Pt here for surgery today to repair right elbow No previous injury or surgery to elbow  Past Medical History  Diagnosis Date  . Hypertension   . Complication of anesthesia     slow to wake up   . PONV (postoperative nausea and vomiting)     after gallbladder in approx 2003  . Arthritis     Past Surgical History  Procedure Laterality Date  . Cholecystectomy    . Abdominal hysterectomy    . Ankle surgery      History reviewed. No pertinent family history. Social History:  reports that she has never smoked. She does not have any smokeless tobacco history on file. She reports that she does not drink alcohol or use illicit drugs.  Allergies:  Allergies  Allergen Reactions  . Aleve [Naproxen Sodium] Rash  . Bactrim [Sulfamethoxazole-Tmp Ds] Itching and Rash  . Clindamycin/Lincomycin Itching and Rash  . Dextromethorphan-Guaifenesin [Guaifenesin-Dm] Itching and Rash  . Guaifenex G [Guaifenesin] Itching and Rash  . Keflex [Cephalexin] Itching and Rash  . Trimethoprim Itching and Rash  . Vicodin [Hydrocodone-Acetaminophen] Nausea And Vomiting  . Nsaids Rash    Medications Prior to Admission  Medication Sig Dispense Refill  . Acetaminophen (TYLENOL PO) Take 1 tablet by mouth every 4 (four) hours as needed (for pain).      Marland Kitchen amLODipine (NORVASC) 10 MG tablet Take 10 mg by mouth daily.      . hydrochlorothiazide (MICROZIDE) 12.5 MG capsule Take 12.5 mg by mouth daily.        Results for orders placed during the hospital encounter of 11/23/12 (from the past 48 hour(s))  BASIC METABOLIC PANEL     Status: Abnormal   Collection Time    11/23/12  2:12 PM      Result Value Range   Sodium 136  135 - 145 mEq/L   Potassium 5.1  3.5 - 5.1 mEq/L   Comment: HEMOLYSIS AT THIS LEVEL MAY AFFECT RESULT   Chloride 99  96 - 112 mEq/L   CO2  24  19 - 32 mEq/L   Glucose, Bld 117 (*) 70 - 99 mg/dL   BUN 13  6 - 23 mg/dL   Creatinine, Ser 0.27  0.50 - 1.10 mg/dL   Calcium 9.1  8.4 - 25.3 mg/dL   GFR calc non Af Amer 88 (*) >90 mL/min   GFR calc Af Amer >90  >90 mL/min   Comment: (NOTE)     The eGFR has been calculated using the CKD EPI equation.     This calculation has not been validated in all clinical situations.     eGFR's persistently <90 mL/min signify possible Chronic Kidney     Disease.  CBC     Status: Abnormal   Collection Time    11/23/12  2:12 PM      Result Value Range   WBC 11.4 (*) 4.0 - 10.5 K/uL   RBC 4.62  3.87 - 5.11 MIL/uL   Hemoglobin 15.3 (*) 12.0 - 15.0 g/dL   HCT 66.4  40.3 - 47.4 %   MCV 91.8  78.0 - 100.0 fL   MCH 33.1  26.0 - 34.0 pg   MCHC 36.1 (*) 30.0 - 36.0 g/dL   RDW 25.9  56.3 - 87.5 %   Platelets 260  150 - 400 K/uL  Dg Chest 1 View  11/23/2012   CLINICAL DATA:  Preoperative evaluation for elbow surgery  EXAM: CHEST - 1 VIEW  COMPARISON:  None.  FINDINGS: The heart size and mediastinal contours are within normal limits. Both lungs are clear. The visualized skeletal structures are unremarkable.  IMPRESSION: No active disease.   Electronically Signed   By: Alcide Clever M.D.   On: 11/23/2012 15:57    ROS-NO RECENT ILLNESSES OR HOSPITALIZATIONS  Blood pressure 152/54, pulse 72, temperature 97.2 F (36.2 C), temperature source Oral, resp. rate 17, height 5\' 1"  (1.549 m), weight 85.4 kg (188 lb 4.4 oz), SpO2 100.00%. Physical Exam  General Appearance:  Alert, cooperative, no distress, appears stated age  Head:  Normocephalic, without obvious abnormality, atraumatic  Eyes:  Pupils equal, conjunctiva/corneas clear,         Throat: Lips, mucosa, and tongue normal; teeth and gums normal  Neck: No visible masses     Lungs:   respirations unlabored  Chest Wall:  No tenderness or deformity  Heart:  Regular rate and rhythm,  Abdomen:   Soft, non-tender,         Extremities: RIGHT UE:  SPLINT IN PLACE, BLOCK PERFORMED GENTLY WIGGLES FINGERS FINGERS WARM WELL PERFUSED  Pulses: 2+ and symmetric  Skin: Skin color, texture, turgor normal, no rashes or lesions     Neurologic: Normal    Assessment/Plan RIGHT ELBOW MONTEGGIA FRACTURE DISLOCATION, RADIAL HEAD FRACTURE/DISLOCATION  RIGHT ELBOW OPEN REDUCTION AND INTERNAL FIXATION AND REPAIR/ARTHROPLASTY AS INDICATED  R/B/A DISCUSSED WITH PT IN OFFICE.  PT VOICED UNDERSTANDING OF PLAN CONSENT SIGNED DAY OF SURGERY PT SEEN AND EXAMINED PRIOR TO OPERATIVE PROCEDURE/DAY OF SURGERY SITE MARKED. QUESTIONS ANSWERED WILL REMAIN IN HOSPITAL FOLLOWING SURGERY  Sharma Covert 11/23/2012, 4:38 PM

## 2012-11-23 NOTE — Progress Notes (Signed)
This pt has screened at an elevated risk for Obstructive Sleep Apnea using the STOP-Bang tool during a presurgical visit.A score of 4 or greater is an elevated risk. 

## 2012-11-23 NOTE — Preoperative (Signed)
Beta Blockers   Reason not to administer Beta Blockers:Not Applicable 

## 2012-11-24 NOTE — Discharge Planning (Signed)
Copy of home instructions and rx reviewed with pt and daughter, who verbalize understandiing.  PT and OT have seen pt now and she is ready for dc.  dc'd per w.c to private car with all personal belongings, acomp. By daughter at 1500.

## 2012-11-24 NOTE — Evaluation (Addendum)
Physical Therapy Evaluation Patient Details Name: Krystal Odom MRN: [id number] DOB: [birth year] Today's Date: 11/24/2012 Time: 4098-1191 PT Time Calculation (min): 32 min  PT Assessment / Plan / Recommendation History of Present Illness  HPI: Pt with fall this past weekend resulting in dislocation of elbow. Pt now s/p ORIF R Elbow.  Clinical Impression  Patient demonstrates modest deficits in functional mobility as indicated below. Patient will have 24/7 supervision upon discharge as needed. Will defer further recommendations to OT. No further acute PT needs.    PT Assessment  Patent does not need any further PT services    Follow Up Recommendations  No PT follow up;Supervision/Assistance - 24 hour    Does the patient have the potential to tolerate intense rehabilitation      Barriers to Discharge        Equipment Recommendations  None recommended by PT    Recommendations for Other Services     Frequency      Precautions / Restrictions Precautions Precautions: Fall;Other (comment) (elbow ) Required Braces or Orthoses: Sling   Pertinent Vitals/Pain 8/10      Mobility  Bed Mobility Bed Mobility: Supine to Sit;Sitting - Scoot to Edge of Bed Supine to Sit: 6: Modified independent (Device/Increase time) Sitting - Scoot to Edge of Bed: 6: Modified independent (Device/Increase time) Details for Bed Mobility Assistance: Increased time to perform Transfers Transfers: Sit to Stand;Stand to Sit Sit to Stand: 6: Modified independent (Device/Increase time) Stand to Sit: 6: Modified independent (Device/Increase time) Details for Transfer Assistance: Increased time to perform Ambulation/Gait Ambulation/Gait Assistance: 7: Independent Ambulation Distance (Feet): 240 Feet Assistive device: None Ambulation/Gait Assistance Details: Cues for relaxation of RUE during ambulation Gait Pattern: Within Functional Limits Stairs: Yes Stairs Assistance: 6: Modified independent  (Device/Increase time);5: Supervision Stair Management Technique: One rail Left;Step to pattern;Forwards Number of Stairs: 4 (x2)       PT Goals(Current goals can be found in the care plan section) Acute Rehab PT Goals Patient Stated Goal: to go home PT Goal Formulation: With patient/family Time For Goal Achievement: 11/26/12 Potential to Achieve Goals: Good  Visit Information  Last PT Received On: 11/24/12 Assistance Needed: +1 History of Present Illness: HPI: Pt with fall this past weekend resulting in dislocation of elbow. Pt now s/p ORIF R Elbow.       Prior Functioning  Home Living Family/patient expects to be discharged to:: Private residence Living Arrangements: Alone Available Help at Discharge: Family Type of Home: House Home Access: Stairs to enter Secretary/administrator of Steps: 4 Entrance Stairs-Rails: Right;Left Home Layout: One level Home Equipment: None Prior Function Level of Independence: Independent Communication Communication: HOH Dominant Hand: Right    Cognition  Cognition Arousal/Alertness: Awake/alert Behavior During Therapy: WFL for tasks assessed/performed Overall Cognitive Status: Within Functional Limits for tasks assessed    Extremity/Trunk Assessment Upper Extremity Assessment Upper Extremity Assessment: Defer to OT evaluation Lower Extremity Assessment Lower Extremity Assessment: Overall WFL for tasks assessed   Balance Balance Balance Assessed: Yes Dynamic Standing Balance Dynamic Standing - Balance Support: During functional activity Dynamic Standing - Level of Assistance: 5: Stand by assistance Dynamic Standing - Comments: steady with activity High Level Balance High Level Balance Activites: Side stepping;Backward walking;Direction changes;Turns High Level Balance Comments: no difficulty with ambulation or balance  End of Session PT - End of Session Equipment Utilized During Treatment: Other (comment) (shoulder  sling) Activity Tolerance: Patient tolerated treatment well;Patient limited by pain Patient left: in chair;with call bell/phone within reach;with family/visitor  present Nurse Communication: Mobility status  GP     12-Dec-2012 0800  PT G-Codes **NOT FOR INPATIENT CLASS**  Functional Assessment Tool Used clinical judgement  Functional Limitation Mobility: Walking and moving around  Mobility: Walking and Moving Around Current Status (O1308) CI  Mobility: Walking and Moving Around Goal Status [phone]) CI  Mobility: Walking and Moving Around Discharge Status [phone]) CI   Fabio Asa 2012-12-12, 8:51 AM Charlotte Crumb, PT DPT  [phone]

## 2012-11-24 NOTE — Progress Notes (Signed)
Pharmacist notified RN that pt is allergic to clindamycin which was ordered by Md for post op tx, md on call Odis Luster,  PA notified and new order given.

## 2012-11-24 NOTE — Discharge Summary (Signed)
Physician Discharge Summary  Patient ID: Krystal Odom MRN: [id number] DOB/AGE: 1940-07-14 72 y.o.  Admit date: 11/23/2012 Discharge date: 11/24/2012  Admission Diagnoses: RIGHT ELBOW FRACTURE  Past Medical History  Diagnosis Date  . Hypertension   . Complication of anesthesia     slow to wake up   . PONV (postoperative nausea and vomiting)     after gallbladder in approx 2003  . Arthritis     Discharge Diagnoses:  Right elbow fracture dislocation  Surgeries: Procedure(s): RIGHT ELBOW OPEN REDUCTION INTERNAL FIXATION (ORIF)  AND POSSIBLE RADIAL HEAD REPLACEMENT  on 11/23/2012    Consultants:  none  Discharged Condition: Improved  Hospital Course: Krystal Odom is an 72 y.o. female who was admitted 11/23/2012 with a chief complaint of No chief complaint on file. , and found to have a diagnosis of RIGHT ELBOW FRACTURE .  They were brought to the operating room on 11/23/2012 and underwent Procedure(s): RIGHT ELBOW OPEN REDUCTION INTERNAL FIXATION (ORIF)  AND POSSIBLE RADIAL HEAD REPLACEMENT .    They were given perioperative antibiotics: Anti-infectives   Start     Dose/Rate Route Frequency Ordered Stop   11/23/12 2330  vancomycin (VANCOCIN) IVPB 1000 mg/200 mL premix     1,000 mg 200 mL/hr over 60 Minutes Intravenous  Once 11/23/12 2324 11/24/12 0127   11/23/12 0600  ceFAZolin (ANCEF) IVPB 2 g/50 mL premix     2 g 100 mL/hr over 30 Minutes Intravenous On call to O.R. 11/22/12 1452 11/23/12 1711    .  They were given sequential compression devices, early ambulation, and Other (comment)ambulation for DVT prophylaxis.  Recent vital signs: Patient Vitals for the past 24 hrs:  BP Temp Temp src Pulse Resp SpO2 Height Weight  11/24/12 0629 110/50 mmHg 98.1 F (36.7 C) Oral 66 20 98 % - -  11/24/12 0135 126/55 mmHg 98.3 F (36.8 C) Oral 80 20 97 % - -  11/23/12 2208 111/39 mmHg 98.2 F (36.8 C) Oral 73 20 95 % 5\' 1"  (1.549 m) 84.505 kg (186 lb 4.8 oz)  11/23/12 2122  106/48 mmHg - - 72 15 99 % - -  11/23/12 2115 - 98.5 F (36.9 C) - 75 17 98 % - -  11/23/12 2100 125/63 mmHg - - 74 21 98 % - -  11/23/12 2045 130/63 mmHg - - 78 16 96 % - -  11/23/12 2030 135/66 mmHg - - 86 24 97 % - -  11/23/12 2015 157/90 mmHg - - 95 26 92 % - -  11/23/12 2006 146/85 mmHg 97.9 F (36.6 C) - 92 21 90 % - -  11/23/12 1523 - - - 72 17 100 % - -  11/23/12 1522 - - - 66 16 100 % - -  11/23/12 1521 - - - 66 20 99 % - -  11/23/12 1520 - - - 66 17 99 % - -  11/23/12 1519 - - - 68 16 100 % - -  11/23/12 1518 - - - 69 20 98 % - -  11/23/12 1517 - - - 67 22 96 % - -  11/23/12 1516 - - - 69 20 98 % - -  11/23/12 1515 - - - 75 15 99 % - -  11/23/12 1514 - - - 74 17 98 % - -  11/23/12 1513 - - - 82 19 98 % - -  11/23/12 1512 - - - 77 23 99 % - -  11/23/12 1511 - - -  77 25 99 % - -  11/23/12 1510 - - - 76 18 99 % - -  11/23/12 1509 - - - 75 17 99 % - -  11/23/12 1508 - - - 75 22 99 % - -  11/23/12 1507 - - - 76 22 99 % - -  11/23/12 1506 - - - 77 22 99 % - -  11/23/12 1505 - - - 75 16 100 % - -  11/23/12 1504 - - - 76 19 99 % - -  11/23/12 1503 - - - 76 17 99 % - -  11/23/12 1502 - - - 75 20 99 % - -  11/23/12 1501 - - - 74 23 99 % - -  11/23/12 1500 - - - 78 19 99 % - -  11/23/12 1459 - - - 76 16 99 % - -  11/23/12 1458 - - - 90 25 100 % - -  11/23/12 1455 - - - 76 22 97 % - -  11/23/12 1427 152/54 mmHg 97.2 F (36.2 C) Oral 77 18 97 % 5\' 1"  (1.549 m) 85.4 kg (188 lb 4.4 oz)  .  Recent laboratory studies: Dg Chest 1 View  11/23/2012   CLINICAL DATA:  Preoperative evaluation for elbow surgery  EXAM: CHEST - 1 VIEW  COMPARISON:  None.  FINDINGS: The heart size and mediastinal contours are within normal limits. Both lungs are clear. The visualized skeletal structures are unremarkable.  IMPRESSION: No active disease.   Electronically Signed   By: Alcide Clever M.D.   On: 11/23/2012 15:57    Discharge Medications:     Medication List         amLODipine 10 MG tablet   Commonly known as:  NORVASC  Take 10 mg by mouth daily.     docusate sodium 100 MG capsule  Commonly known as:  COLACE  Take 1 capsule (100 mg total) by mouth 2 (two) times daily.     hydrochlorothiazide 12.5 MG capsule  Commonly known as:  MICROZIDE  Take 12.5 mg by mouth daily.     oxyCODONE-acetaminophen 10-325 MG per tablet  Commonly known as:  PERCOCET  Take 1 tablet by mouth every 4 (four) hours as needed for pain.     TYLENOL PO  Take 1 tablet by mouth every 4 (four) hours as needed (for pain).     vitamin C 500 MG tablet  Commonly known as:  ASCORBIC ACID  Take 1 tablet (500 mg total) by mouth 2 (two) times daily.        Diagnostic Studies: Dg Chest 1 View  11/23/2012   CLINICAL DATA:  Preoperative evaluation for elbow surgery  EXAM: CHEST - 1 VIEW  COMPARISON:  None.  FINDINGS: The heart size and mediastinal contours are within normal limits. Both lungs are clear. The visualized skeletal structures are unremarkable.  IMPRESSION: No active disease.   Electronically Signed   By: Alcide Clever M.D.   On: 11/23/2012 15:57   Dg Elbow Complete Right  11/19/2012   CLINICAL DATA:  Fall  EXAM: RIGHT ELBOW - COMPLETE 3+ VIEW  COMPARISON:  None.  FINDINGS: There is an acute, comminuted fracture deformity involving the proximal ulna. There is volar angulation of the distal fracture fragments. Fracture of the radial head is also identified with mild distal displacement of the distal fracture fragment.  IMPRESSION: 1. Comminuted fracture of the proximal ulna with volar angulation of the distal fracture fragments.  2. Radial  head fracture.   Electronically Signed   By: Signa Kell M.D.   On: 11/19/2012 20:17   Dg Forearm Right  11/19/2012   CLINICAL DATA:  Fall. Injury  EXAM: RIGHT FOREARM - 2 VIEW  COMPARISON:  None  FINDINGS: There is an acute, comminuted fracture deformity involving the proximal ulna. There is volar angulation of the distal fracture fragments. No dislocations  identified.  IMPRESSION: 1. Acute comminuted fracture involves the proximal ulna.   Electronically Signed   By: Signa Kell M.D.   On: 11/19/2012 20:15   Ct Head Wo Contrast  11/19/2012   CLINICAL DATA:  Laceration above right eye  EXAM: CT HEAD WITHOUT CONTRAST  TECHNIQUE: Contiguous axial images were obtained from the base of the skull through the vertex without intravenous contrast.  COMPARISON:  None.  FINDINGS: Mild periorbital soft tissue swelling on the right. No skull fracture. No intracranial hemorrhage or extra-axial fluid. No infarct or mass. No skull fracture.  IMPRESSION: No acute intracranial abnormality.   Electronically Signed   By: Esperanza Heir M.D.   On: 11/19/2012 21:16    They benefited maximally from their hospital stay and there were no complications.     Disposition: 01-Home or Self Care     Signed: Sharma Covert 11/24/2012, 7:57 AM

## 2012-11-24 NOTE — Evaluation (Signed)
Occupational Therapy Evaluation Patient Details Name: Krystal Odom MRN: [id number] DOB: 06/17/1940 Today's Date: 11/24/2012 Time: 7829-5621 OT Time Calculation (min): 24 min  OT Assessment / Plan / Recommendation History of present illness HPI: Pt with fall this past weekend resulting in dislocation of elbow. Pt now s/p ORIF R Elbow.   Clinical Impression   Pt is 72 y/o female s/p ORIF Right elbow impacting her abiity to perform ADL's, self care tasks independently. She plans to d/c home later today and will benefit from out-pt OT/hand therapy as well as f/u w/ MD to progress home program/AROM. Pt verbalizes understanding of AROM digits, tendon gliding, extension etc as well as bathing/dressing techniques. She will have 24/7 assist from family at d/c. Recommend 3:1    OT Assessment  All further OT needs can be met in the next venue of care;Progress rehab of shoulder as ordered by MD at follow-up appointment    Follow Up Recommendations  Outpatient OT    Barriers to Discharge      Equipment Recommendations  3 in 1 bedside comode    Recommendations for Other Services    Frequency       Precautions / Restrictions Precautions Precautions: Fall;Other (comment) Required Braces or Orthoses: Sling   Pertinent Vitals/Pain 8-9/10 R UE pain, RN made aware and gave medications. Repositioned, ice, rest.    ADL  Eating/Feeding: Performed;Set up Where Assessed - Eating/Feeding: Chair Grooming: Performed;Wash/dry hands;Set up Where Assessed - Grooming: Unsupported standing Upper Body Bathing: Simulated;Minimal assistance Where Assessed - Upper Body Bathing: Unsupported sitting Lower Body Bathing: Simulated;Minimal assistance Where Assessed - Lower Body Bathing: Supported sit to stand Upper Body Dressing: Simulated;Minimal assistance Where Assessed - Upper Body Dressing: Unsupported sitting Lower Body Dressing: Simulated;Minimal assistance Where Assessed - Lower Body Dressing:  Supported sit to stand Toilet Transfer: Research scientist (life sciences) Method: Sit to Barista: Comfort height toilet;Grab bars Toileting - Architect and Hygiene: Performed;Supervision/safety Where Assessed - Engineer, mining and Hygiene: Standing;Sit to stand from 3-in-1 or toilet Tub/Shower Transfer Method: Not assessed Equipment Used: Other (comment) (RUE sling) Transfers/Ambulation Related to ADLs: Pt is overall supervision level for functional mobiltiy and trasnfers ADL Comments: Pt & pt's daughter were educated in ADL techniques s/p recent elbow fx/dislocation. Pt will benefit from Min assist for bathing/dressing as well as shower transfers. Recommend initial 24/assist  & f/u w/ MD. Pt will benefit from out-pt OT/hand therapy as MD sees fit.     OT Diagnosis: Acute pain  OT Problem List: Impaired UE functional use;Pain OT Treatment Interventions:     OT Goals(Current goals can be found in the care plan section) Acute Rehab OT Goals Patient Stated Goal: to go home  Visit Information  Last OT Received On: 11/24/12 Assistance Needed: +1 History of Present Illness: HPI: Pt with fall this past weekend resulting in dislocation of elbow. Pt now s/p ORIF R Elbow.       Prior Functioning     Home Living Family/patient expects to be discharged to:: Private residence Living Arrangements: Alone Available Help at Discharge: Family Type of Home: House Home Access: Stairs to enter Secretary/administrator of Steps: 4 Entrance Stairs-Rails: Right;Left Home Layout: One level Home Equipment: None Prior Function Level of Independence: Independent Communication Communication: HOH Dominant Hand: Right    Vision/Perception Vision - History Baseline Vision: Wears glasses all the time Patient Visual Report: No change from baseline   Cognition  Cognition Arousal/Alertness: Awake/alert Behavior During Therapy: [facility] for tasks  assessed/performed Overall Cognitive Status: Within Functional Limits for tasks assessed    Extremity/Trunk Assessment Upper Extremity Assessment Upper Extremity Assessment: RUE deficits/detail RUE Deficits / Details: Pt in right UE forearm/elbow surgical cast, elbow in 90* RUE: Unable to fully assess due to immobilization Lower Extremity Assessment Lower Extremity Assessment: Overall WFL for tasks assessed;Defer to PT evaluation    Mobility Bed Mobility Bed Mobility: Not assessed Details for Bed Mobility Assistance: Pt up in chair Transfers Transfers: Sit to Stand;Stand to Sit Sit to Stand: 6: Modified independent (Device/Increase time);From chair/3-in-1;From toilet Stand to Sit: 6: Modified independent (Device/Increase time);To chair/3-in-1;To toilet Details for Transfer Assistance: Increased time to perform    Exercise General Exercises - Upper Extremity Digit Composite Flexion: AROM;Right;10 reps;Seated Composite Extension: AROM;Right;10 reps;Seated Method for sponge bathing under operated UE: Supervision/safety;Caregiver independent with task Donning/doffing sling/immobilizer: Supervision/safety;Caregiver independent with task Correct positioning of sling/immobilizer: Supervision/safety;Caregiver independent with task Sling wearing schedule (on at all times/off for ADL's):  (Pt educated to f/u w/ MD) Proper positioning of operated UE when showering:  (Pt educated to f/u w/ MD) Positioning of UE while sleeping: Caregiver independent with task;Supervision/safety;Min-guard   Balance Balance Balance Assessed: Yes Dynamic Standing Balance Dynamic Standing - Balance Support: During functional activity Dynamic Standing - Level of Assistance: 5: Stand by assistance   End of Session OT - End of Session Equipment Utilized During Treatment: Other (comment) (Right shoulder sling) Activity Tolerance: Patient tolerated treatment well Patient left: in chair;with call bell/phone within  reach;with nursing/sitter in room;with family/visitor present Nurse Communication: Mobility status;Other (comment) (recommend 3:1)  GO Functional Assessment Tool Used: Clinical judgement Functional Limitation: Self care Self Care Current Status (A5409): At least 20 percent but less than 40 percent impaired, limited or restricted Self Care Goal Status (W1191): At least 1 percent but less than 20 percent impaired, limited or restricted Self Care Discharge Status [phone]): At least 20 percent but less than 40 percent impaired, limited or restricted   Alm Bustard 11/24/2012, 2:08 PM

## 2012-11-25 NOTE — Op Note (Signed)
NAMEMarland Kitchen  FRANKI, ALCAIDE NO.:  [id number]  MEDICAL RECORD NO.:  [id number]  LOCATION:  6N32C                        FACILITY:  [facility]  PHYSICIAN:  Madelynn Done, MD  DATE OF BIRTH:  October 10, 1940  DATE OF PROCEDURE:  11/23/2012 DATE OF DISCHARGE:                              OPERATIVE REPORT   PREOPERATIVE DIAGNOSES: 1. Right elbow Monteggia fracture dislocation. 2. Right elbow radial head fracture. 3. Right elbow lateral ulnar collateral ligament tear.  POSTOPERATIVE DIAGNOSES: 1. Right elbow Monteggia fracture dislocation. 2. Right elbow radial head fracture. 3. Right elbow lateral ulnar collateral ligament tear.  ATTENDING PHYSICIAN:  Madelynn Done, MD who scrubbed and present for the entire procedure.  ASSISTANT SURGEON:  None.  ANESTHESIA:  General via LMA.  TOURNIQUET TIME:  Less than 2 hours, 250 mmHg.  SURGICAL IMPLANTS: 1. Biomet proximal olecranon plate with a total of 5 screws     proximally, 3 screws distally. 2. One 3.0 compression screw for the radial head.  SURGICAL PROCEDURE: 1. Open treatment of Monteggia type fracture dislocation at the elbow     with internal fixation of proximal olecranon. 2. Open treatment of radial head fracture with internal fixation. 3. Repair of lateral ulnar collateral ligament, elbow with local     tissue. 4. Radiographs 2 views right elbow.  RADIOGRAPHS:  Interpretation of intraoperative radiographs do show the reduction of the radiocapitellar and ulnar humeral joint, and there was good alignment of both planes of the internal fixation of the radial head and proximal olecranon.  SURGICAL INDICATIONS:  Krystal Odom is a 72 year old female who sustained a Monteggia fracture dislocation, presented to my office several days after injury.  It was recommended that she undergo the above procedure. Risks, benefits, and alternatives were discussed in detail with the patient and signed informed consent was  obtained.  Risks include, but not limited to bleeding, infection, damage to nearby nerves, arteries, or tendons, loss of motion of wrist and digits, incomplete relief of symptoms, nonunion, malunion, hardware failure, and need for further surgical intervention.  DESCRIPTION OF PROCEDURE:  The patient was properly identified in the preop holding area and marked with a permanent marker made on the left and right elbow to indicate the correct operative site.  The patient was then brought back to the operative room, placed supine on the anesthesia room table where general anesthesia was administered.  The patient tolerated this well.  A well-padded tourniquet was then placed on the right brachium and sealed with 1000 drape.  The right upper extremity was then prepped and draped in normal sterile fashion.  Time-out was called, correct side was identified, and procedure then begun. Attention then turned to the right elbow where limb was then brought across the chest.  A curvilinear incision was made directly over olecranon tip, curving radially.  Limb was then elevated.  Tourniquet insufflated.  Dissection was carried down through the skin and subcutaneous tissues.  Attention was then turned to the radial head. The patient did have disruption of the lateral ulnar collateral ligament complex and the arthrotomy that was created from the posterior dislocation of the radial head was then extended both proximally  and distally.  This was exposed the radial head very nicely.  After this, the arm was then maximally supinated to expose the anterior head fracture.  Once this was carried out, 2 K-wires crush compression screws were then placed.  Once this felt a small fracture, fragment was amenable to one screw.  Following this, the K-wire was then over drilled with the drill bit and the 16 mm compression screw was then placed, which with good compression across the fracture site.  Guidewires were then  removed.  There was good stable position of the fracture fragment through a full range of motion.  The wound was then thoroughly irrigated.  Attention was then turned to fixation of the proximal olecranon.  The fascia layer was then incised longitudinally over the fracture site.  The muscle was then opened up and the fracture site was then exposed.  The reduction clamps were then placed on the fracture site and then reduced and held nicely in position.  Following this, the Biomet proximal olecranon plate was then applied to the posterior surface and held temporarily in place with K-wires.  After confirm reduction, fixation was then carried out with two locking screws proximally.  Distal fixation was then carried out with the compression slot with the nonlocking screw and 3.5 mm screw.  The remaining screw holes were then filled appropriate with a combination of locking and nonlocking screws.  The wound was then thoroughly irrigated.  The plate taps were then bent with good position against the sideplate with a blow- out of the proximal olecranon along the radial side.  Copious wound irrigation done throughout.  After wound irrigation, the fascial layer over the plate was then closed with 0 Vicryl suture.  The arthrotomy on disruption, lateral and collateral ligament complex were then repaired with 1.4 mm JuggerKnot anchor.  This was placed in the posterior aspect where the tissue had been avulsed and tied down nicely back over the bone with completion of repair of the lateral collateral ligament with local tissue.  This was then reinforced with the anconeus and triceps with 0 Vicryl.  The subcutaneous tissue was closed with a combination of 2-0 and 4-0 Vicryl.  The skin was then closed with skin staples.  The Xeroform dressing was then applied posteriorly.  Sterile compressive bandage was then applied.  The patient was then placed in a long-arm splint, extubated, and taken to recovery room  in good condition.  POSTPROCEDURE PLAN:  The patient was admitted for overnight for IV antibiotics and pain control, discharge in the morning, see me back in the office in approximately 12-13 days x-rays, out of the splint and then begin the therapy regimen and worked very slowly, radiographs at each visit.     Madelynn Done, MD     FWO/MEDQ  D:  11/23/2012  T:  11/24/2012  Job:  [id number]

## 2012-11-28 ENCOUNTER — Encounter (HOSPITAL_COMMUNITY): Payer: Self-pay | Admitting: Orthopedic Surgery

## 2014-06-14 IMAGING — CT CT HEAD W/O CM
1 series · 16 of 30 positions shown, 20 images · non-contrast
Comparison: None.

CLINICAL DATA: Laceration above right eye

EXAM:
CT HEAD WITHOUT CONTRAST
TECHNIQUE: Contiguous axial images were obtained from the base of the skull
through the vertex without intravenous contrast.

[Series 2: head 4.8 h37s · axial · 0.46mm/px · z∈[-124,+13]mm · 16 of 32 slices shown, 20 images]
[im 2/32  brain]
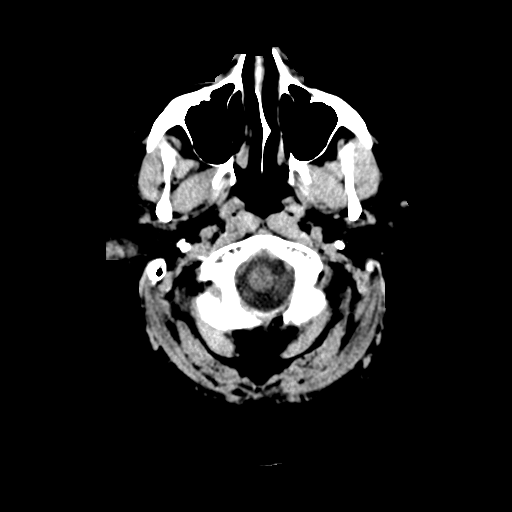
[im 2/32  bone]
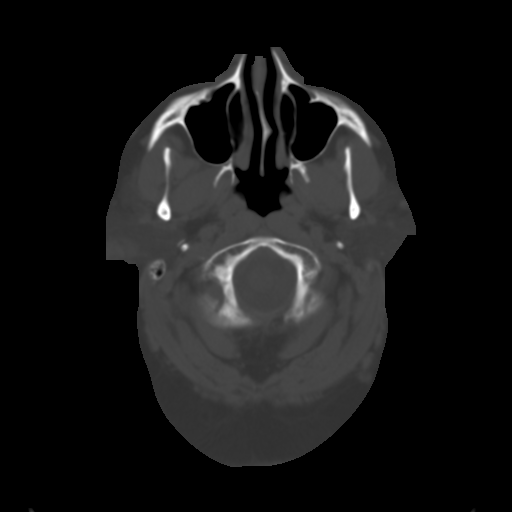
[im 4/32  brain]
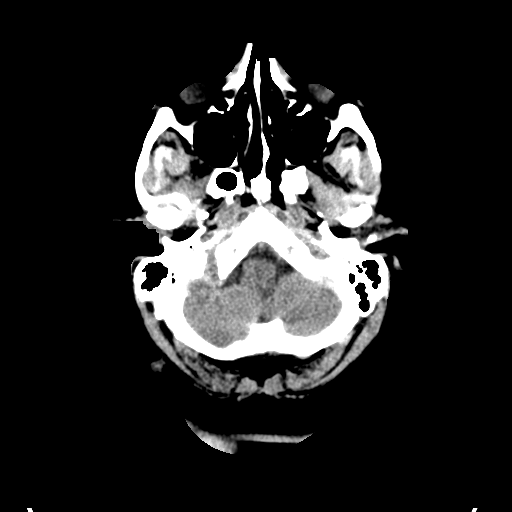
[im 6/32  brain]
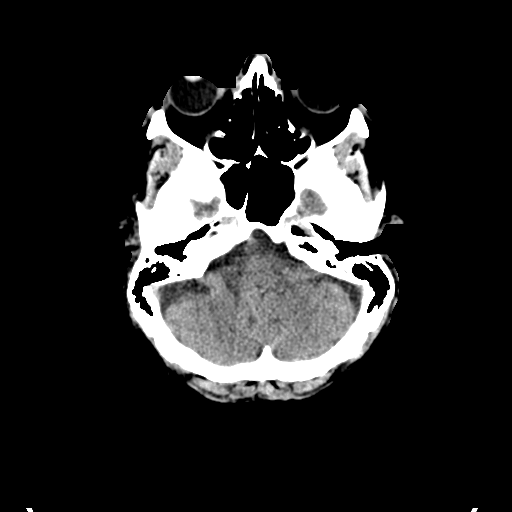
[im 8/32  brain]
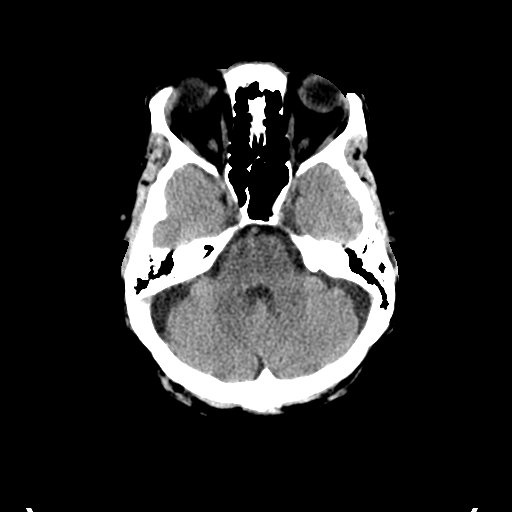
[im 9/32  brain]
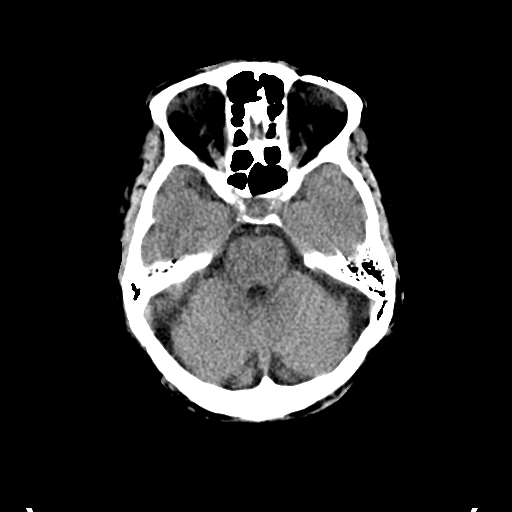
[im 9/32  bone]
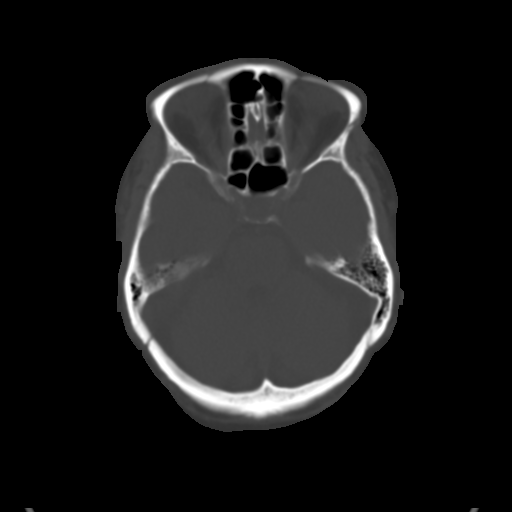
[im 11/32  brain]
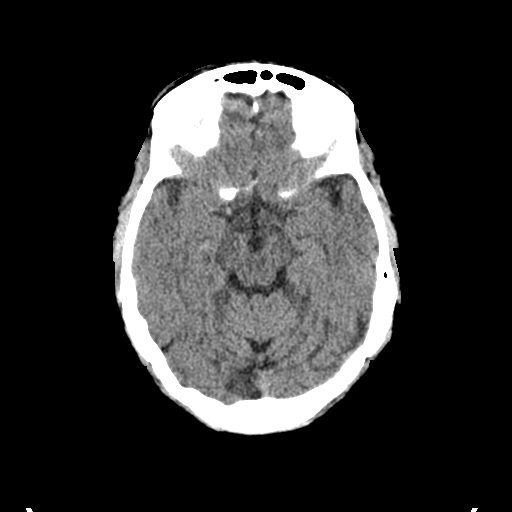
[im 13/32  brain]
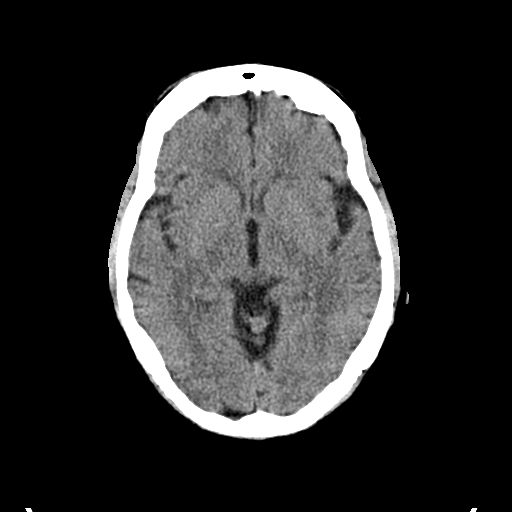
[im 15/32  brain]
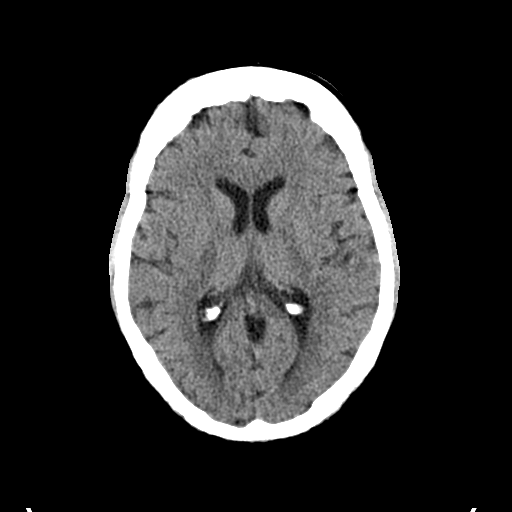
[im 17/32  brain]
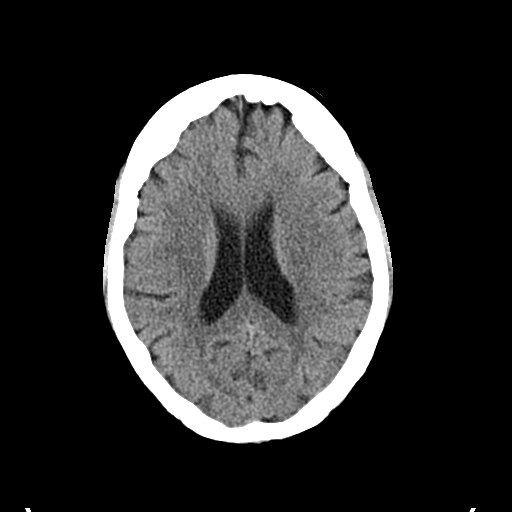
[im 17/32  bone]
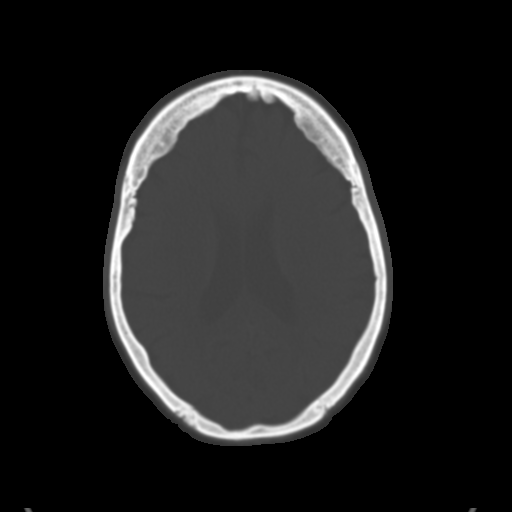
[im 19/32  brain]
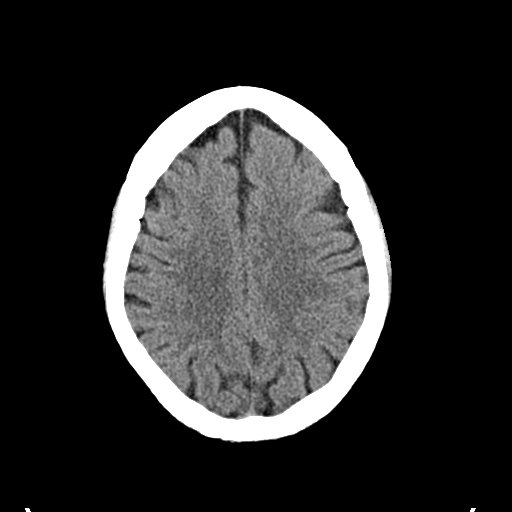
[im 21/32  brain]
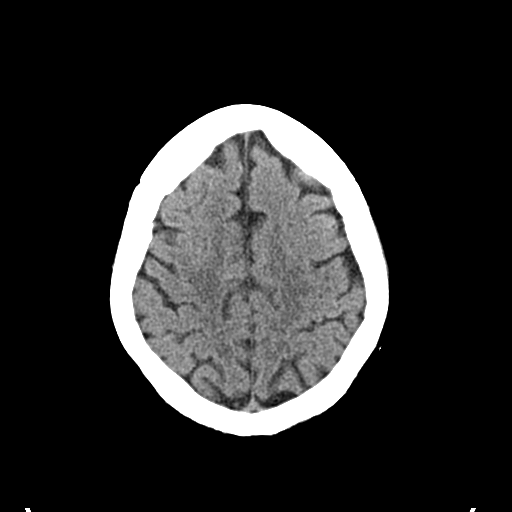
[im 23/32  brain]
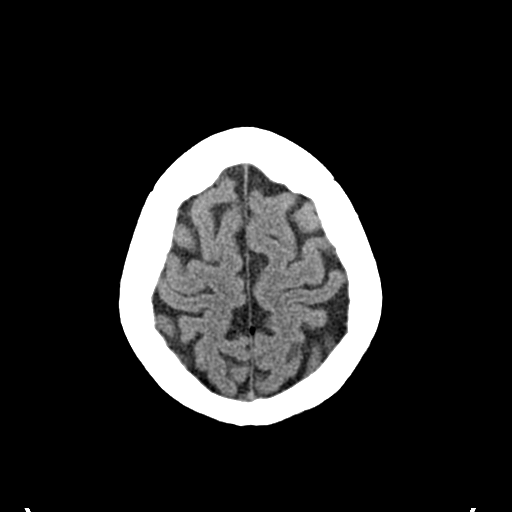
[im 24/32  brain]
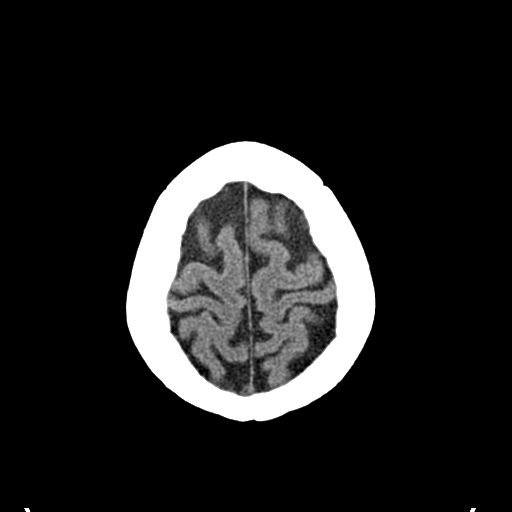
[im 24/32  bone]
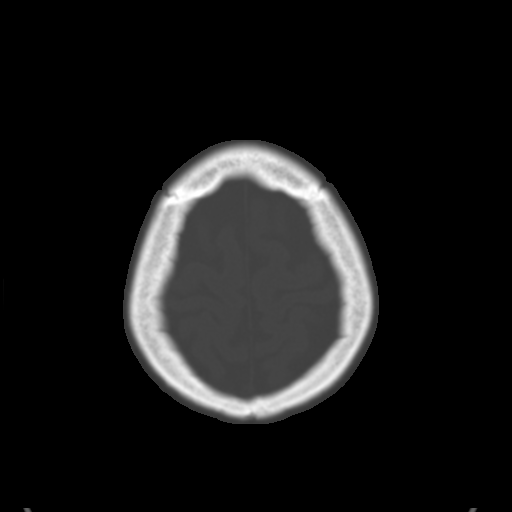
[im 26/32  brain]
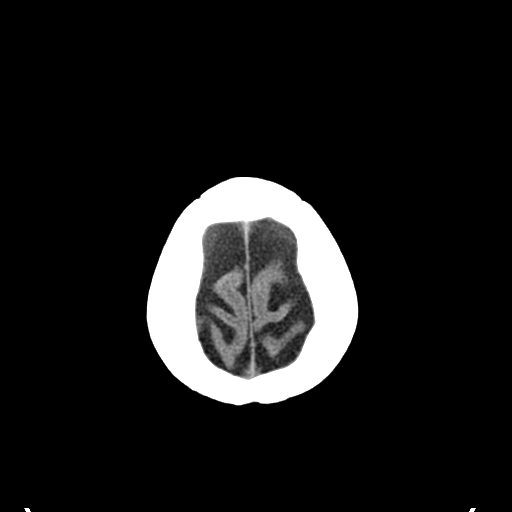
[im 28/32  brain]
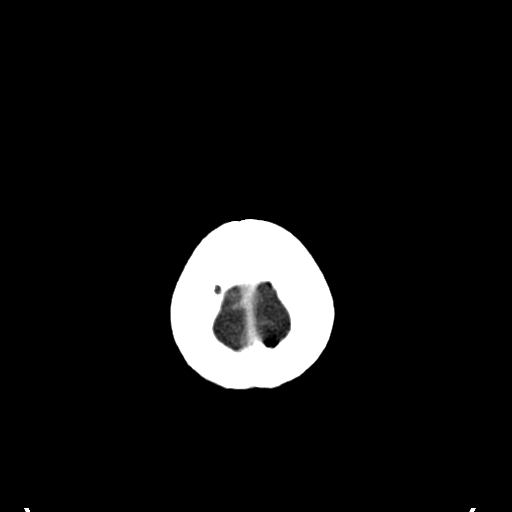
[im 30/32  brain]
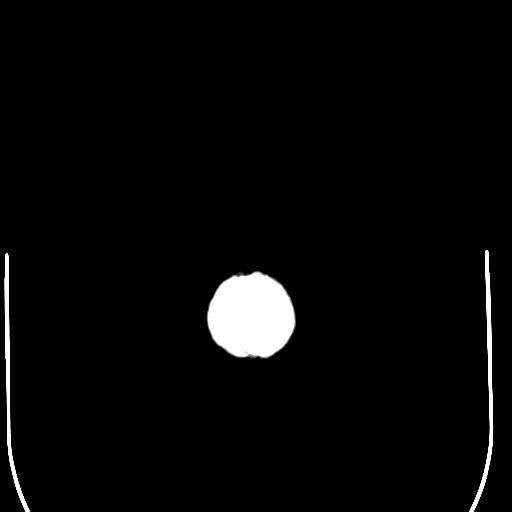

[16 of 30 positions shown; findings below may reference images not displayed]

FINDINGS: Mild periorbital soft tissue swelling on the right. No skull
fracture. No intracranial hemorrhage or extra-axial fluid. No
infarct or mass. No skull fracture.
IMPRESSION: No acute intracranial abnormality.

## 2014-06-14 IMAGING — CR DG FOREARM 2V*R*
2 series · 2 of 2 positions shown · non-contrast
Comparison: None

CLINICAL DATA: Fall. Injury

EXAM:
RIGHT FOREARM - 2 VIEW

[x forearm ap right]
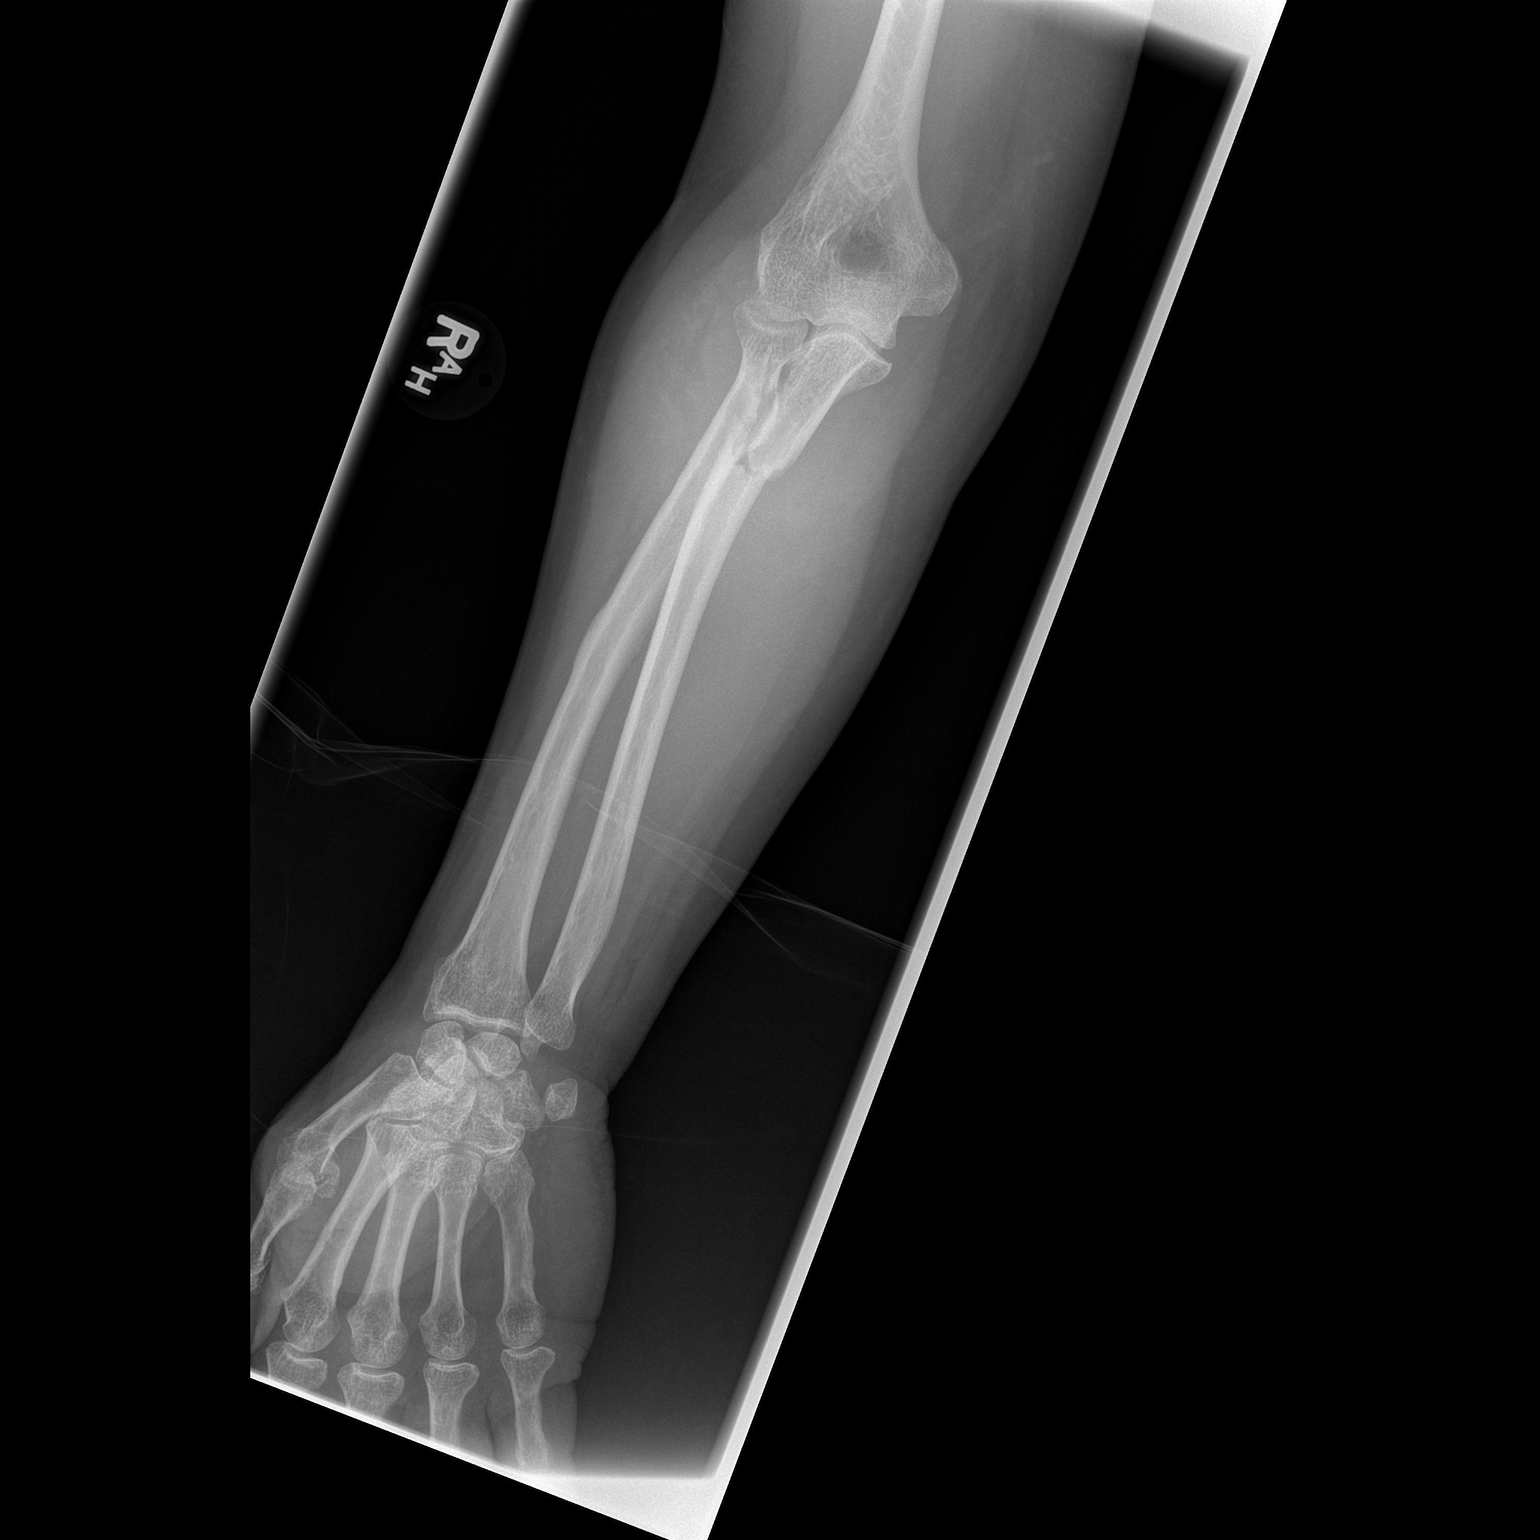

[x forearm lat right]
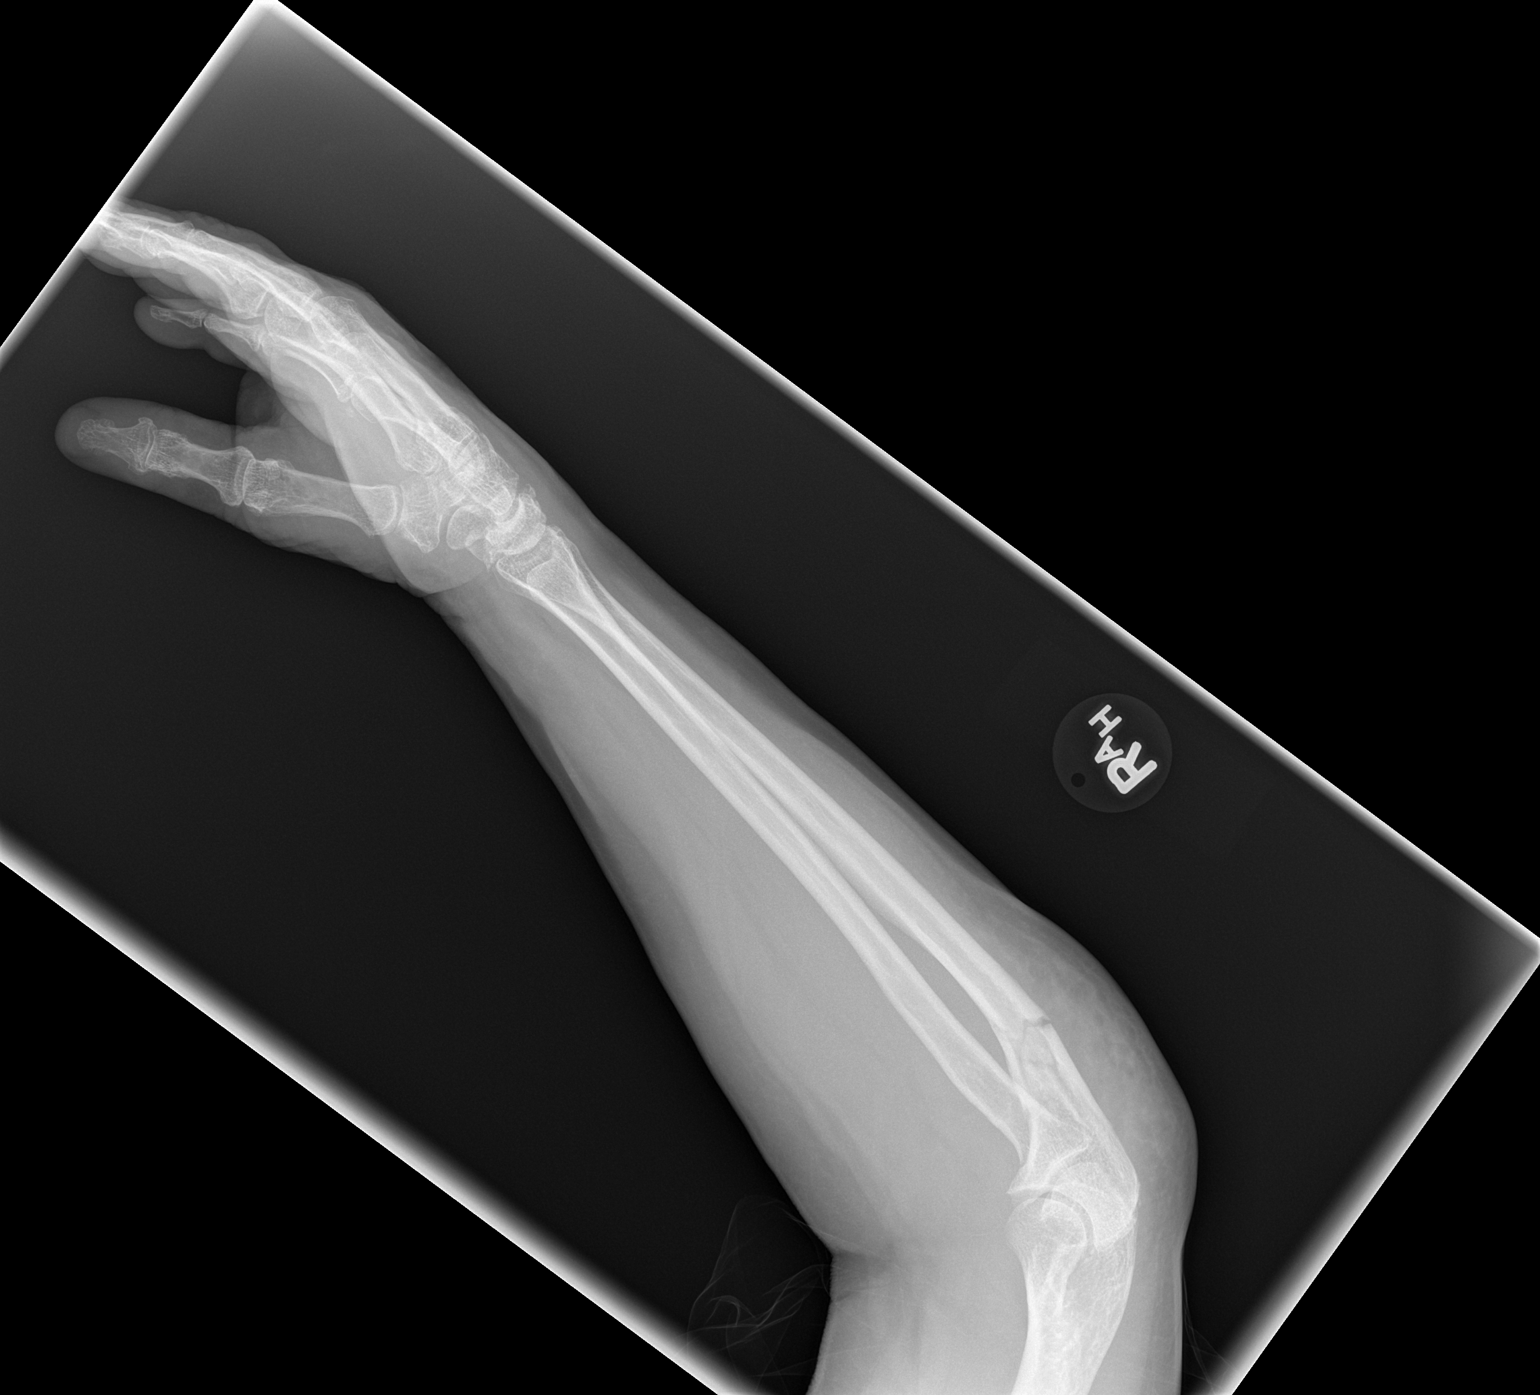

[2 of 2 positions shown; findings below may reference images not displayed]

FINDINGS: There is an acute, comminuted fracture deformity involving the
proximal ulna. There is volar angulation of the distal fracture
fragments. No dislocations identified.
IMPRESSION: 1. Acute comminuted fracture involves the proximal ulna.

## 2014-06-18 IMAGING — CR DG CHEST 1V
1 series · 1 of 1 positions shown · non-contrast
Comparison: None.

CLINICAL DATA: Preoperative evaluation for elbow surgery

EXAM:
CHEST - 1 VIEW

[w chest pa]
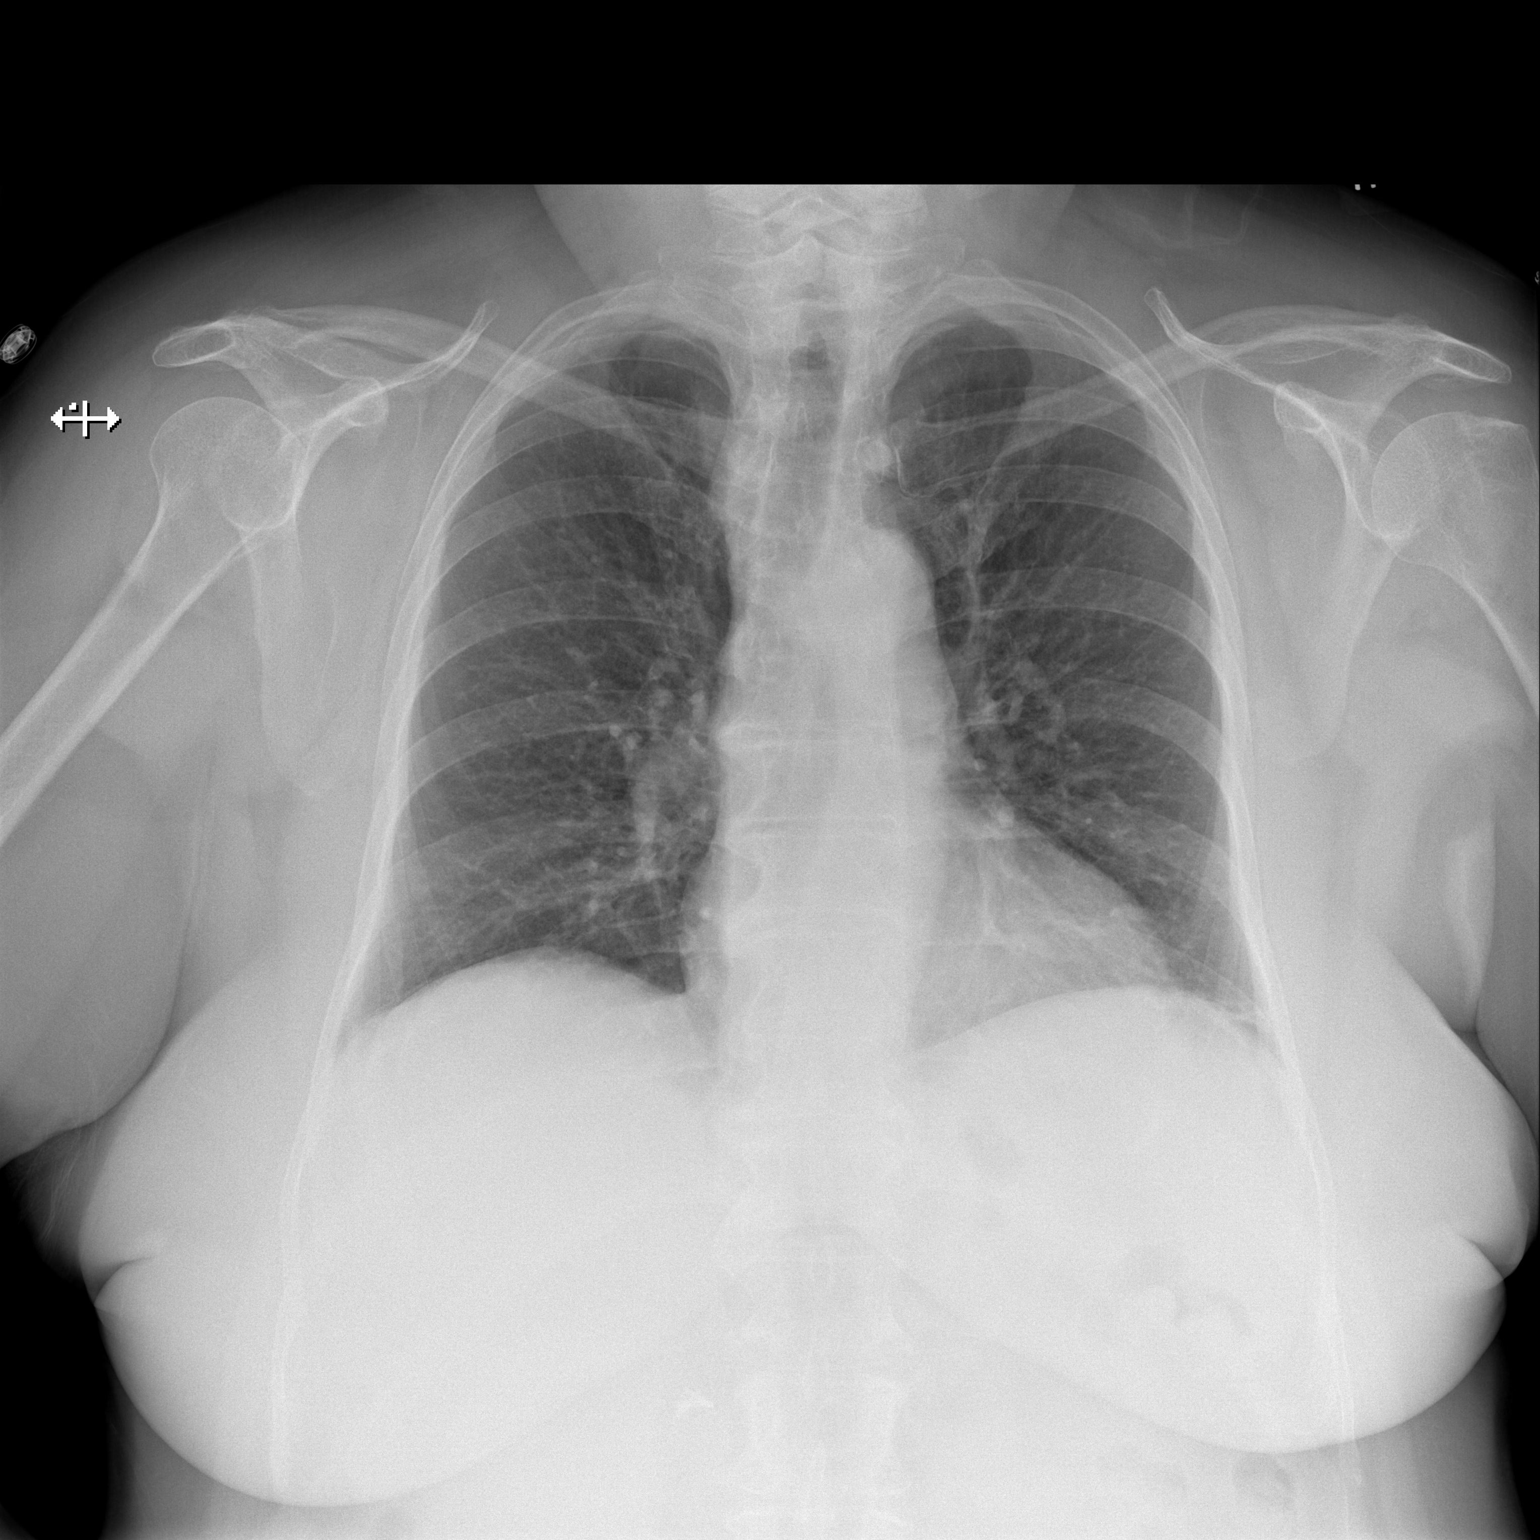

[1 of 1 positions shown; findings below may reference images not displayed]

FINDINGS: The heart size and mediastinal contours are within normal limits.
Both lungs are clear. The visualized skeletal structures are
unremarkable.
IMPRESSION: No active disease.

## 2023-04-13 DEATH — deceased
